# Patient Record
Sex: Male | Born: 1945 | Race: White | Hispanic: No | Marital: Married | Smoking: Former smoker
Health system: Southern US, Community
[De-identification: ages and names within clinical notes are randomized; demographics above are authoritative.]

## PROBLEM LIST (undated history)

## (undated) DIAGNOSIS — M720 Palmar fascial fibromatosis [Dupuytren]: Secondary | ICD-10-CM

## (undated) DIAGNOSIS — M545 Low back pain, unspecified: Secondary | ICD-10-CM

## (undated) DIAGNOSIS — I7300 Raynaud's syndrome without gangrene: Secondary | ICD-10-CM

## (undated) DIAGNOSIS — M2560 Stiffness of unspecified joint, not elsewhere classified: Secondary | ICD-10-CM

## (undated) DIAGNOSIS — F329 Major depressive disorder, single episode, unspecified: Secondary | ICD-10-CM

## (undated) DIAGNOSIS — L409 Psoriasis, unspecified: Secondary | ICD-10-CM

## (undated) DIAGNOSIS — IMO0001 Reserved for inherently not codable concepts without codable children: Secondary | ICD-10-CM

## (undated) DIAGNOSIS — C61 Malignant neoplasm of prostate: Secondary | ICD-10-CM

## (undated) DIAGNOSIS — R112 Nausea with vomiting, unspecified: Secondary | ICD-10-CM

## (undated) DIAGNOSIS — G8929 Other chronic pain: Secondary | ICD-10-CM

## (undated) DIAGNOSIS — R011 Cardiac murmur, unspecified: Secondary | ICD-10-CM

## (undated) DIAGNOSIS — M1990 Unspecified osteoarthritis, unspecified site: Secondary | ICD-10-CM

## (undated) DIAGNOSIS — S99911A Unspecified injury of right ankle, initial encounter: Secondary | ICD-10-CM

## (undated) DIAGNOSIS — F32A Depression, unspecified: Secondary | ICD-10-CM

## (undated) DIAGNOSIS — Z9889 Other specified postprocedural states: Secondary | ICD-10-CM

## (undated) DIAGNOSIS — H9190 Unspecified hearing loss, unspecified ear: Secondary | ICD-10-CM

## (undated) DIAGNOSIS — R51 Headache: Secondary | ICD-10-CM

## (undated) DIAGNOSIS — C801 Malignant (primary) neoplasm, unspecified: Secondary | ICD-10-CM

## (undated) DIAGNOSIS — N400 Enlarged prostate without lower urinary tract symptoms: Secondary | ICD-10-CM

## (undated) HISTORY — DX: Unspecified osteoarthritis, unspecified site: M19.90

## (undated) HISTORY — PX: ROTATOR CUFF REPAIR: SHX139

## (undated) HISTORY — DX: Raynaud's syndrome without gangrene: I73.00

## (undated) HISTORY — DX: Psoriasis, unspecified: L40.9

## (undated) HISTORY — DX: Cardiac murmur, unspecified: R01.1

---

## 1999-03-11 ENCOUNTER — Emergency Department (HOSPITAL_COMMUNITY): Admission: EM | Admit: 1999-03-11 | Discharge: 1999-03-11 | Payer: Self-pay | Admitting: Emergency Medicine

## 1999-03-11 ENCOUNTER — Encounter: Payer: Self-pay | Admitting: Emergency Medicine

## 2002-01-03 ENCOUNTER — Emergency Department (HOSPITAL_COMMUNITY): Admission: EM | Admit: 2002-01-03 | Discharge: 2002-01-03 | Payer: Self-pay | Admitting: Emergency Medicine

## 2004-01-14 ENCOUNTER — Encounter: Admission: RE | Admit: 2004-01-14 | Discharge: 2004-01-14 | Payer: Self-pay | Admitting: Internal Medicine

## 2004-03-10 ENCOUNTER — Encounter (INDEPENDENT_AMBULATORY_CARE_PROVIDER_SITE_OTHER): Payer: Self-pay | Admitting: *Deleted

## 2004-03-10 ENCOUNTER — Ambulatory Visit (HOSPITAL_COMMUNITY): Admission: RE | Admit: 2004-03-10 | Discharge: 2004-03-10 | Payer: Self-pay | Admitting: Surgery

## 2004-03-10 HISTORY — PX: MASS EXCISION: SHX2000

## 2005-08-29 ENCOUNTER — Ambulatory Visit: Payer: Self-pay | Admitting: Family Medicine

## 2005-11-19 ENCOUNTER — Ambulatory Visit: Payer: Self-pay | Admitting: Family Medicine

## 2005-12-14 ENCOUNTER — Ambulatory Visit: Payer: Self-pay | Admitting: Internal Medicine

## 2005-12-28 ENCOUNTER — Ambulatory Visit: Payer: Self-pay | Admitting: Internal Medicine

## 2005-12-31 ENCOUNTER — Encounter: Admission: RE | Admit: 2005-12-31 | Discharge: 2005-12-31 | Payer: Self-pay | Admitting: Internal Medicine

## 2006-02-28 ENCOUNTER — Ambulatory Visit: Payer: Self-pay | Admitting: Internal Medicine

## 2006-05-07 ENCOUNTER — Ambulatory Visit: Payer: Self-pay | Admitting: Internal Medicine

## 2006-05-27 ENCOUNTER — Ambulatory Visit: Payer: Self-pay | Admitting: Internal Medicine

## 2006-09-09 ENCOUNTER — Ambulatory Visit: Payer: Self-pay | Admitting: Internal Medicine

## 2006-11-07 ENCOUNTER — Ambulatory Visit: Payer: Self-pay | Admitting: Internal Medicine

## 2006-12-31 ENCOUNTER — Ambulatory Visit: Payer: Self-pay | Admitting: Internal Medicine

## 2006-12-31 LAB — CONVERTED CEMR LAB
Chol/HDL Ratio, serum: 4.2
Cholesterol: 193 mg/dL (ref 0–200)
LDL Cholesterol: 123 mg/dL — ABNORMAL HIGH (ref 0–99)
Total Bilirubin: 0.7 mg/dL (ref 0.3–1.2)
Total Protein: 6.5 g/dL (ref 6.0–8.3)
Triglyceride fasting, serum: 122 mg/dL (ref 0–149)

## 2007-01-07 ENCOUNTER — Ambulatory Visit: Payer: Self-pay | Admitting: Internal Medicine

## 2007-01-07 LAB — CONVERTED CEMR LAB
Basophils Relative: 0.8 % (ref 0.0–1.0)
Eosinophils Relative: 1.6 % (ref 0.0–5.0)
HCT: 40.9 % (ref 39.0–52.0)
Hemoglobin: 13.9 g/dL (ref 13.0–17.0)
Neutrophils Relative %: 46.8 % (ref 43.0–77.0)
RBC: 4.27 M/uL (ref 4.22–5.81)
RDW: 11.9 % (ref 11.5–14.6)
WBC: 6.9 10*3/uL (ref 4.5–10.5)

## 2007-02-07 ENCOUNTER — Ambulatory Visit: Payer: Self-pay | Admitting: Internal Medicine

## 2007-06-13 ENCOUNTER — Ambulatory Visit (HOSPITAL_COMMUNITY): Admission: RE | Admit: 2007-06-13 | Discharge: 2007-06-14 | Payer: Self-pay | Admitting: Specialist

## 2007-06-26 ENCOUNTER — Telehealth: Payer: Self-pay | Admitting: *Deleted

## 2007-08-05 ENCOUNTER — Telehealth: Payer: Self-pay | Admitting: Internal Medicine

## 2007-09-03 DIAGNOSIS — E785 Hyperlipidemia, unspecified: Secondary | ICD-10-CM | POA: Insufficient documentation

## 2007-09-03 DIAGNOSIS — L408 Other psoriasis: Secondary | ICD-10-CM | POA: Insufficient documentation

## 2007-09-15 ENCOUNTER — Telehealth (INDEPENDENT_AMBULATORY_CARE_PROVIDER_SITE_OTHER): Payer: Self-pay | Admitting: *Deleted

## 2007-09-17 ENCOUNTER — Ambulatory Visit: Payer: Self-pay | Admitting: Internal Medicine

## 2007-09-17 DIAGNOSIS — M1990 Unspecified osteoarthritis, unspecified site: Secondary | ICD-10-CM | POA: Insufficient documentation

## 2007-09-17 DIAGNOSIS — M67919 Unspecified disorder of synovium and tendon, unspecified shoulder: Secondary | ICD-10-CM | POA: Insufficient documentation

## 2007-09-17 DIAGNOSIS — M6530 Trigger finger, unspecified finger: Secondary | ICD-10-CM | POA: Insufficient documentation

## 2007-09-17 DIAGNOSIS — M719 Bursopathy, unspecified: Secondary | ICD-10-CM

## 2007-09-17 DIAGNOSIS — M545 Low back pain: Secondary | ICD-10-CM

## 2007-09-18 ENCOUNTER — Telehealth: Payer: Self-pay | Admitting: Internal Medicine

## 2007-09-24 ENCOUNTER — Telehealth (INDEPENDENT_AMBULATORY_CARE_PROVIDER_SITE_OTHER): Payer: Self-pay | Admitting: *Deleted

## 2007-10-20 ENCOUNTER — Telehealth: Payer: Self-pay | Admitting: Internal Medicine

## 2007-12-12 ENCOUNTER — Telehealth: Payer: Self-pay | Admitting: Internal Medicine

## 2007-12-23 ENCOUNTER — Ambulatory Visit: Payer: Self-pay | Admitting: Internal Medicine

## 2007-12-23 ENCOUNTER — Telehealth: Payer: Self-pay | Admitting: Internal Medicine

## 2007-12-23 DIAGNOSIS — N138 Other obstructive and reflux uropathy: Secondary | ICD-10-CM

## 2007-12-23 DIAGNOSIS — T887XXA Unspecified adverse effect of drug or medicament, initial encounter: Secondary | ICD-10-CM | POA: Insufficient documentation

## 2007-12-23 DIAGNOSIS — N401 Enlarged prostate with lower urinary tract symptoms: Secondary | ICD-10-CM

## 2007-12-23 LAB — CONVERTED CEMR LAB
AST: 20 units/L (ref 0–37)
Bilirubin, Direct: 0.2 mg/dL (ref 0.0–0.3)
Cholesterol: 230 mg/dL (ref 0–200)
Direct LDL: 144.4 mg/dL
HDL: 42.6 mg/dL (ref >39.0)
PSA: 0.49 ng/mL (ref 0.10–4.00)
Total CHOL/HDL Ratio: 5.4
Triglycerides: 122 mg/dL (ref 0–149)
VLDL: 24 mg/dL (ref 0–40)

## 2008-01-19 ENCOUNTER — Telehealth: Payer: Self-pay | Admitting: Internal Medicine

## 2008-01-23 ENCOUNTER — Telehealth: Payer: Self-pay | Admitting: Internal Medicine

## 2008-01-23 ENCOUNTER — Encounter: Payer: Self-pay | Admitting: Internal Medicine

## 2008-02-16 ENCOUNTER — Ambulatory Visit (HOSPITAL_COMMUNITY): Admission: RE | Admit: 2008-02-16 | Discharge: 2008-02-17 | Payer: Self-pay | Admitting: Specialist

## 2008-03-22 ENCOUNTER — Telehealth: Payer: Self-pay | Admitting: Internal Medicine

## 2008-05-12 ENCOUNTER — Ambulatory Visit: Payer: Self-pay | Admitting: Internal Medicine

## 2008-06-10 ENCOUNTER — Ambulatory Visit: Payer: Self-pay | Admitting: Internal Medicine

## 2008-06-23 ENCOUNTER — Telehealth: Payer: Self-pay | Admitting: Internal Medicine

## 2008-06-24 ENCOUNTER — Telehealth: Payer: Self-pay | Admitting: Internal Medicine

## 2008-06-24 ENCOUNTER — Encounter: Payer: Self-pay | Admitting: Internal Medicine

## 2008-06-24 ENCOUNTER — Telehealth: Payer: Self-pay | Admitting: *Deleted

## 2008-07-12 ENCOUNTER — Ambulatory Visit: Payer: Self-pay | Admitting: Internal Medicine

## 2008-07-12 DIAGNOSIS — M5030 Other cervical disc degeneration, unspecified cervical region: Secondary | ICD-10-CM | POA: Insufficient documentation

## 2008-07-12 DIAGNOSIS — G43909 Migraine, unspecified, not intractable, without status migrainosus: Secondary | ICD-10-CM | POA: Insufficient documentation

## 2008-08-10 ENCOUNTER — Ambulatory Visit: Payer: Self-pay | Admitting: Internal Medicine

## 2008-09-16 ENCOUNTER — Encounter: Admission: RE | Admit: 2008-09-16 | Discharge: 2008-09-16 | Payer: Self-pay | Admitting: Specialist

## 2008-10-04 ENCOUNTER — Ambulatory Visit: Payer: Self-pay | Admitting: Internal Medicine

## 2008-10-15 ENCOUNTER — Encounter: Payer: Self-pay | Admitting: Internal Medicine

## 2008-11-01 ENCOUNTER — Ambulatory Visit: Payer: Self-pay | Admitting: Internal Medicine

## 2008-12-15 ENCOUNTER — Ambulatory Visit: Payer: Self-pay | Admitting: Internal Medicine

## 2008-12-30 ENCOUNTER — Inpatient Hospital Stay (HOSPITAL_COMMUNITY): Admission: RE | Admit: 2008-12-30 | Discharge: 2008-12-31 | Payer: Self-pay | Admitting: Orthopedic Surgery

## 2008-12-30 HISTORY — PX: ANTERIOR CERVICAL DECOMP/DISCECTOMY FUSION: SHX1161

## 2009-02-04 ENCOUNTER — Telehealth: Payer: Self-pay | Admitting: Internal Medicine

## 2009-02-15 ENCOUNTER — Ambulatory Visit: Payer: Self-pay | Admitting: Internal Medicine

## 2009-03-28 ENCOUNTER — Ambulatory Visit: Payer: Self-pay | Admitting: Internal Medicine

## 2009-03-28 ENCOUNTER — Telehealth: Payer: Self-pay | Admitting: Internal Medicine

## 2009-03-28 DIAGNOSIS — R0789 Other chest pain: Secondary | ICD-10-CM

## 2009-04-25 ENCOUNTER — Ambulatory Visit: Payer: Self-pay | Admitting: Internal Medicine

## 2009-04-25 DIAGNOSIS — G5790 Unspecified mononeuropathy of unspecified lower limb: Secondary | ICD-10-CM | POA: Insufficient documentation

## 2009-05-19 ENCOUNTER — Telehealth: Payer: Self-pay | Admitting: *Deleted

## 2009-06-17 ENCOUNTER — Ambulatory Visit: Payer: Self-pay | Admitting: Internal Medicine

## 2009-06-17 DIAGNOSIS — N410 Acute prostatitis: Secondary | ICD-10-CM

## 2009-06-30 ENCOUNTER — Ambulatory Visit: Payer: Self-pay | Admitting: Internal Medicine

## 2009-07-25 ENCOUNTER — Telehealth (INDEPENDENT_AMBULATORY_CARE_PROVIDER_SITE_OTHER): Payer: Self-pay | Admitting: *Deleted

## 2009-08-09 ENCOUNTER — Ambulatory Visit: Payer: Self-pay | Admitting: Internal Medicine

## 2009-10-11 ENCOUNTER — Ambulatory Visit: Payer: Self-pay | Admitting: Internal Medicine

## 2009-10-11 DIAGNOSIS — K429 Umbilical hernia without obstruction or gangrene: Secondary | ICD-10-CM | POA: Insufficient documentation

## 2009-11-30 ENCOUNTER — Telehealth: Payer: Self-pay | Admitting: Internal Medicine

## 2009-12-12 ENCOUNTER — Telehealth: Payer: Self-pay | Admitting: Internal Medicine

## 2010-03-29 ENCOUNTER — Encounter: Payer: Self-pay | Admitting: Internal Medicine

## 2010-04-10 ENCOUNTER — Telehealth: Payer: Self-pay | Admitting: Internal Medicine

## 2010-05-25 ENCOUNTER — Telehealth: Payer: Self-pay | Admitting: Internal Medicine

## 2010-06-01 ENCOUNTER — Observation Stay (HOSPITAL_COMMUNITY): Admission: RE | Admit: 2010-06-01 | Discharge: 2010-06-02 | Payer: Self-pay | Admitting: Specialist

## 2010-06-01 HISTORY — PX: SHOULDER ARTHROSCOPY WITH ROTATOR CUFF REPAIR: SHX5685

## 2010-06-08 ENCOUNTER — Telehealth: Payer: Self-pay | Admitting: Internal Medicine

## 2010-07-11 ENCOUNTER — Telehealth: Payer: Self-pay | Admitting: Internal Medicine

## 2010-08-03 ENCOUNTER — Telehealth: Payer: Self-pay | Admitting: Internal Medicine

## 2010-09-26 ENCOUNTER — Telehealth: Payer: Self-pay | Admitting: Internal Medicine

## 2010-09-28 ENCOUNTER — Ambulatory Visit: Payer: Self-pay | Admitting: Internal Medicine

## 2010-12-12 ENCOUNTER — Telehealth: Payer: Self-pay | Admitting: Internal Medicine

## 2010-12-26 ENCOUNTER — Telehealth: Payer: Self-pay | Admitting: Internal Medicine

## 2010-12-28 ENCOUNTER — Ambulatory Visit: Admit: 2010-12-28 | Payer: Self-pay | Admitting: Internal Medicine

## 2011-01-23 NOTE — Progress Notes (Signed)
Summary: prednisone request  Phone Note Call from Patient Call back at Home Phone [phone]   Caller: vm - woman Summary of Call: Stung by several yellow jackets.  Eye swollen shut.  Round of Prednisone requested to [facility]. Happened last pm.  Took Benadryl.  Breathing is okay.  Rudy Jew, RN  July 11, 2010 8:14 AM  Initial call taken by: Rudy Jew, RN,  July 11, 2010 8:10 AM  Follow-up for Phone Call        per dr Lovell Sheehan may have prednisone 10 mg dose pack Follow-up by: Willy Eddy, LPN,  July 11, 2010 8:42 AM  Additional Follow-up for Phone Call Additional follow up Details #1::        Phone Call Completed Additional Follow-up by: Rudy Jew, RN,  July 11, 2010 9:03 AM    New/Updated Medications: PREDNISONE (PAK) 10 MG TABS (PREDNISONE) As Directed Prescriptions: PREDNISONE (PAK) 10 MG TABS (PREDNISONE) As Directed  #1 x 0   Entered by:   Rudy Jew, RN   Authorized by:   Stacie Glaze MD   Signed by:   Rudy Jew, RN on 07/11/2010   Method used:   Electronically to        [facility]* (retail)       [address] [facility] [address] [place] [employer] [place], [place]  [address]       Ph: [phone] or [phone]       Fax: [phone]   RxID:   [id number]

## 2011-01-23 NOTE — Letter (Signed)
Summary: Request for Surgical Clearance/[facility]  Request for Surgical Clearance/[facility]   Imported By: Maryln Gottron 04/03/2010 10:46:01  _____________________________________________________________________  External Attachment:    Type:   Image     Comment:   External Document

## 2011-01-23 NOTE — Progress Notes (Signed)
Summary: Pt req increased quantity for Oxycodone from #60 to #90  Phone Note Call from Patient Call back at [facility] Phone [phone] Call back at [phone] cell. Pt says to call either #   Caller: Patient Summary of Call: Pt called and is wanting to know if Dr. Lovell Sheehan can increase quantity of Oxycodone from #60 to 90#? Pt said that he has built up an immunity to med and is having to take more than prescribed amount so he is running out earlier. Pt req to pick up script by Friday 09/29/10 Initial call taken by: Lucy Antigua,  September 26, 2010 1:17 PM  Follow-up for Phone Call        pt has not had ov in over a year- needs ov to discuss Follow-up by: Willy Eddy, LPN,  September 26, 2010 1:50 PM  Additional Follow-up for Phone Call Additional follow up Details #1::        I called pt and sch ov for 09/28/10 at 11:15am as noted above.  Additional Follow-up by: Lucy Antigua,  September 26, 2010 2:53 PM

## 2011-01-23 NOTE — Progress Notes (Signed)
Summary: Pt req script for Oxycodone  Phone Note Call from Patient Call back at Home Phone [phone]   Caller: Patient Summary of Call: Pt called and is req script for Oxycodone. Pls call when ready for pick up.  Initial call taken by: Lucy Antigua,  May 25, 2010 8:15 AM  Follow-up for Phone Call        nnot due until 6/18-pt informed Follow-up by: Willy Eddy, LPN,  May 25, 3809 10:24 AM

## 2011-01-23 NOTE — Progress Notes (Signed)
Summary: REFILL REQUEST  Phone Note Refill Request Message from:  Patient on August 03, 2010 12:23 PM  Refills Requested: Medication #1:  OXYCODONE HCL 15 MG  TABS one by mouth two times a day as needed   Notes: Pt adv that he can be reached at [phone] when Rx is ready for p/u.    Initial call taken by: Debbra Riding,  August 03, 2010 12:23 PM  Follow-up for Phone Call        pt informed- ready for pick up Follow-up by: Willy Eddy, LPN,  August 04, 2010 10:58 AM    Prescriptions: OXYCODONE HCL 15 MG  TABS (OXYCODONE HCL) one by mouth two times a day as needed  #60 x 0   Entered by:   Willy Eddy, LPN   Authorized by:   Stacie Glaze MD   Signed by:   Willy Eddy, LPN on 91/47/8295   Method used:   Print then Give to Patient   RxID:   [id number] OXYCODONE HCL 15 MG  TABS (OXYCODONE HCL) one by mouth two times a day as needed  #60 x 0   Entered by:   Willy Eddy, LPN   Authorized by:   Stacie Glaze MD   Signed by:   Willy Eddy, LPN on 52/84/1324   Method used:   Print then Give to Patient   RxID:   [id number]

## 2011-01-23 NOTE — Progress Notes (Signed)
Summary: refill  Phone Note Refill Request Call back at Home Phone [phone] Message from:  Patient---live call  Refills Requested: Medication #1:  OXYCODONE HCL 15 MG  TABS one by mouth two times a day as needed    Prescriptions: OXYCODONE HCL 15 MG  TABS (OXYCODONE HCL) one by mouth two times a day as needed  #60 x 0   Entered by:   Willy Eddy, LPN   Authorized by:   Stacie Glaze MD   Signed by:   Willy Eddy, LPN on 09/81/1914   Method used:   Print then Give to Patient   RxID:   [id number] OXYCODONE HCL 15 MG  TABS (OXYCODONE HCL) one by mouth two times a day as needed  #60 x 0   Entered by:   Willy Eddy, LPN   Authorized by:   Stacie Glaze MD   Signed by:   Willy Eddy, LPN on 69/62/9528   Method used:   Print then Give to Patient   RxID:   [id number]

## 2011-01-23 NOTE — Progress Notes (Signed)
Summary: refill  Phone Note Refill Request Call back at Home Phone [phone] Message from:  Patient---live call  Refills Requested: Medication #1:  OXYCODONE HCL 15 MG  TABS one by mouth two times a day as needed pt would like to come by today and pick up.  Initial call taken by: Warnell Forester,  April 10, 2010 11:20 AM    Prescriptions: OXYCODONE HCL 15 MG  TABS (OXYCODONE HCL) one by mouth two times a day as needed  #60 x 0   Entered by:   Willy Eddy, LPN   Authorized by:   Stacie Glaze MD   Signed by:   Willy Eddy, LPN on 41/66/0630   Method used:   Print then Mail to Patient   RxID:   [id number] OXYCODONE HCL 15 MG  TABS (OXYCODONE HCL) one by mouth two times a day as needed  #60 x 0   Entered by:   Willy Eddy, LPN   Authorized by:   Stacie Glaze MD   Signed by:   Willy Eddy, LPN on 25/42/7062   Method used:   Print then Mail to Patient   RxID:   [id number]

## 2011-01-23 NOTE — Assessment & Plan Note (Signed)
Summary: MED CHECK/REFILLS/CJR   Vital Signs:  Patient profile:   65 year old male Height:      69 inches Weight:      202 pounds BMI:     29.94 Temp:     98.2 degrees F oral Pulse rate:   68 / minute Resp:     14 per minute BP sitting:   110 / 70  (left arm)  Vitals Entered By: Willy Eddy, LPN (September 28, 2010 12:12 PM) CC: roa-discuss pain meds Is Patient Diabetic? No   Primary Care Provider:  Stacie Glaze MD  CC:  roa-discuss pain meds.  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) 4)  Chest Pain, Atypical  (ICD-786.59) 5)  Disc Disease, Cervical  (ICD-722.4) 6)  Migraine, Chronic  (ICD-346.90) 7)  Degenerative Disc Disease, Cervical Spine  (ICD-722.4) 8)  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) 9)  Uns Advrs Eff Uns Rx Medicinal&biological Sbstnc  (ICD-995.20) 10)  Rotator Cuff Syndrome, Right  (ICD-726.10) 11)  Trigger Finger, Left Middle  (ICD-727.03) 12)  Family History Diabetes 1st Degree Relative  (ICD-V18.0) 13)  Family History of Cad Male 1st Degree Relative <50  (ICD-V17.3) 14)  Osteoarthritis  (ICD-715.90) 15)  Low Back Pain  (ICD-724.2) 16)  Psoriasis  (ICD-696.1) 17)  Hyperlipidemia  (ICD-272.4)  Current Problems (verified): 1)  Umbilical Hernia  (ICD-553.1) 2)  Prostatitis, Acute  (ICD-601.0) 3)  Peripheral Neuropathy, 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) 4)  Chest Pain, Atypical  (ICD-786.59) 5)  Disc Disease,  Cervical  (ICD-722.4) 6)  Migraine, Chronic  (ICD-346.90) 7)  Degenerative Disc Disease, Cervical Spine  (ICD-722.4) 8)  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 1 Ml Weekly 2)  Oxycodone Hcl 15 Mg  Tabs (Oxycodone Hcl) .... One By Mouth Two Times A Day As Needed 3)  Aspirin 81 Mg  Tbec (Aspirin) .... Daily 4)  Cvs Ibuprofen 200 Mg Tabs (Ibuprofen) .... 2 Three Times A Day 5)  Ambien Cr 12.5 Mg  Tbcr (Zolpidem Tartrate) .... At Bedtime As Needed 6)  Gabapentin 300 Mg Caps (Gabapentin) .... One By Mouth Three Times A Day ( May Give Three 100 By Mouth Three Times A Day) 7)  Nasonex 50 Mcg/act Susp (Mometasone Furoate) .Marland Kitchen.. 1 Spray Each Nostril Two Times A Day As Needed 8)  Fluoxetine Hcl 20 Mg Caps (Fluoxetine Hcl) .Marland Kitchen.. 1 Once  Daily 9)  Diclofenac Sodium 75 Mg Tbec (Diclofenac Sodium) .Marland Kitchen.. 1 Two Times A Day 10)  Maxalt-Mlt 10 Mg Tbdp (Rizatriptan Benzoate) .... One By Mouth As Needed Migraine 11)  Prednisone (Pak) 10 Mg Tabs (Prednisone) .... As Directed  Current Medications (verified): 1)  Enbrel 50 Mg/ml Soln (Etanercept) .Marland Kitchen.. 1 Ml Weekly 2)  Oxycodone Hcl 15 Mg  Tabs (Oxycodone Hcl) .... One By Mouth Two Times A Day As Needed 3)  Aspirin 81 Mg  Tbec (Aspirin) .... Daily 4)  Cvs Ibuprofen 200 Mg Tabs (Ibuprofen) .... 2 Three Times A Day 5)  Ambien Cr 12.5 Mg  Tbcr (Zolpidem Tartrate) .... At Bedtime As Needed 6)  Gabapentin 300 Mg Caps (Gabapentin) .... One By Mouth Three Times A  Day ( May Give Three 100 By Mouth Three Times A Day) 7)  Nasonex 50 Mcg/act Susp (Mometasone Furoate) .Marland Kitchen.. 1 Spray Each Nostril Two Times A Day As Needed 8)  Fluoxetine Hcl 20 Mg Caps (Fluoxetine Hcl) .Marland Kitchen.. 1 Once Daily 9)  Maxalt-Mlt 10 Mg Tbdp (Rizatriptan Benzoate) .... One By Mouth As Needed Migraine 10)  Sulfasalazine 500 Mg Tabs (Sulfasalazine) .Marland Kitchen.. 1 Two Times A Day  Allergies (verified): No Known Drug Allergies  Past History:  Family History: Last updated: 09/17/2007 Family History of CAD Male 1st degree relative <50 brothers and father Family History Diabetes 1st degree relative  mother Family History of Prostate CA 1st degree relative <50  Social History: Last updated: 09/17/2007 Married Former Smoker Alcohol use-no Drug use-no  Risk Factors: Smoking Status: quit (09/28/2010)  Past medical, surgical, family and social histories (including risk factors) reviewed, and no changes noted (except as noted below).  Past Medical History: Reviewed history from 08/10/2008 and no changes required. Hyperlipidemia psoriasis Low back pain Osteoarthritis raynauds  Past Surgical History: Rotator cuff repair x 2 cervical fusion 2 levels with nonunion  Family History: Reviewed history from 09/17/2007 and no changes required. Family History of CAD Male 1st degree relative <50 brothers and father Family History Diabetes 1st degree relative  mother Family History of Prostate CA 1st degree relative <50  Social History: Reviewed history from 09/17/2007 and no changes required. Married Former Smoker Alcohol use-no Drug use-no  Review of Systems  The patient denies anorexia, fever, weight loss, weight gain, vision loss, decreased hearing, hoarseness, chest pain, syncope, dyspnea on exertion, peripheral edema, prolonged cough, headaches, hemoptysis, abdominal pain, melena, hematochezia, severe indigestion/heartburn, hematuria, incontinence, genital sores, muscle weakness,  suspicious skin lesions, transient blindness, difficulty walking, depression, unusual weight change, abnormal bleeding, enlarged lymph nodes, angioedema, and breast masses.    Physical Exam  General:  Well-developed,well-nourished,in no acute distress; alert,appropriate and cooperative throughout examination Head:  male-pattern balding.  normocephalic.   Eyes:  No corneal or conjunctival inflammation noted. EOMI. Perrla. Funduscopic exam benign, without hemorrhages, exudates or papilledema. Vision grossly normal. Ears:  R ear normal and L ear normal.   Nose:  External nasal examination shows no deformity or inflammation. Nasal mucosa are pink and moist without lesions or exudates. Mouth:  Oral mucosa and oropharynx without lesions or exudates.  Teeth in good repair. Neck:  decreased ROM.  nuchal rigidity.   Lungs:  Normal respiratory effort, chest expands symmetrically. Lungs are clear to auscultation, no crackles or wheezes. Heart:  Normal rate and regular rhythm. S1 and S2 normal without gallop, murmur, click, rub or other extra sounds. Abdomen:  Bowel sounds positive,abdomen soft and non-tender without masses, organomegaly umbilical hernia.   Msk:  decreased  ROM, joint tenderness, and joint swelling.   tendon calcinosis of the right hand   plantar spur Extremities:  No clubbing, cyanosis, edema, or deformity noted with normal full range of motion of all joints.   Neurologic:  RUE sensory loss and LUE sensory loss.     Impression & Recommendations:  Problem # 1:  OSTEOARTHRITIS (ICD-715.90)  The following medications were removed from the medication list:    Diclofenac Sodium 75 Mg Tbec (Diclofenac sodium) .Marland Kitchen... 1 two times a day His updated medication list for this problem includes:    Oxycodone Hcl 15 Mg Tabs (Oxycodone hcl) ..... One by mouth three time s a day as needed    Aspirin 81 Mg Tbec (Aspirin) .Marland Kitchen... Daily    Cvs Ibuprofen 200 Mg Tabs (Ibuprofen) .Marland Kitchen... 2 three times a  day  Discussed use of medications, application of heat or cold, and exercises.   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 1 two times a day His updated medication list for this problem includes:    Oxycodone Hcl 15 Mg Tabs (Oxycodone hcl) ..... One by mouth three time s a day as needed    Aspirin 81 Mg Tbec (Aspirin) .Marland Kitchen... Daily    Cvs Ibuprofen 200 Mg Tabs (Ibuprofen) .Marland Kitchen... 2 three times a day  Discussed use of moist heat or ice, modified activities, medications, and stretching/strengthening exercises. Back care instructions given. To be seen in 2 weeks if no improvement; sooner if worsening of symptoms.   Complete Medication List: 1)  Enbrel 50 Mg/ml Soln (Etanercept) .Marland Kitchen.. 1 ml weekly 2)  Oxycodone Hcl 15 Mg Tabs (Oxycodone hcl) .... One by mouth three time s a day as needed 3)  Aspirin 81 Mg Tbec (Aspirin) .... Daily 4)  Cvs Ibuprofen 200 Mg Tabs (Ibuprofen) .... 2 three times a day 5)  Ambien Cr 12.5 Mg Tbcr (Zolpidem tartrate) .... At bedtime as needed 6)  Gabapentin 300 Mg Caps (Gabapentin) .... One by mouth three times a day ( may give three 100 by mouth three times a day) 7)  Nasonex 50 Mcg/act Susp (Mometasone furoate) .Marland Kitchen.. 1 spray each nostril two times a day as needed 8)  Fluoxetine Hcl 20 Mg Caps (Fluoxetine hcl) .Marland Kitchen.. 1 once daily 9)  Maxalt-mlt 10 Mg Tbdp (Rizatriptan benzoate) .... One by mouth as needed migraine 10)  Sulfasalazine 500 Mg Tabs (Sulfasalazine) .Marland Kitchen.. 1 two times a day Prescriptions: OXYCODONE HCL 15 MG  TABS (OXYCODONE HCL) one by mouth three time s a day as needed  #90 x 0   Entered and Authorized by:   Stacie Glaze MD   Signed by:   Stacie Glaze MD on 09/28/2010   Method used:   Print  then Give to Patient   RxID:   [id number] OXYCODONE HCL 15 MG  TABS (OXYCODONE HCL) one by mouth three time s a day as needed  #90 x 0   Entered and Authorized by:   Stacie Glaze MD   Signed by:   Stacie Glaze MD on 09/28/2010   Method used:   Print then Give to Patient   RxID:   [id number] OXYCODONE HCL 15 MG  TABS (OXYCODONE HCL) one by mouth three time s  a day as needed  #90 x 0   Entered and Authorized by:   Stacie Glaze MD   Signed by:   Stacie Glaze MD on 09/28/2010   Method used:   Print then Give to Patient   RxID:   [id number]    Immunization History:  Influenza Immunization History:    Influenza:  historical (09/23/2010)

## 2011-01-25 NOTE — Progress Notes (Signed)
Summary: refill  Phone Note Refill Request Call back at Home Phone [phone] Message from:  Patient--live call  Refills Requested: Medication #1:  OXYCODONE HCL 15 MG  TABS one by mouth three time s a day as needed call pt when ready. is scheduled for a possible surgery with Dr Shon Baton. pt cancelled his appt for this Thursday with Dr Lovell Sheehan.  Initial call taken by: Warnell Forester,  December 26, 2010 10:58 AM    Prescriptions: OXYCODONE HCL 15 MG  TABS (OXYCODONE HCL) one by mouth three time s a day as needed  #90 x 0   Entered by:   Willy Eddy, LPN   Authorized by:   Stacie Glaze MD   Signed by:   Willy Eddy, LPN on 14/78/2956   Method used:   Print then Give to Patient   RxID:   [id number] OXYCODONE HCL 15 MG  TABS (OXYCODONE HCL) one by mouth three time s a day as needed  #90 x 0   Entered by:   Willy Eddy, LPN   Authorized by:   Stacie Glaze MD   Signed by:   Willy Eddy, LPN on 28/41/3244   Method used:   Print then Give to Patient   RxID:   [id number] OXYCODONE HCL 15 MG  TABS (OXYCODONE HCL) one by mouth three time s a day as needed  #90 x 0   Entered by:   Willy Eddy, LPN   Authorized by:   Stacie Glaze MD   Signed by:   Willy Eddy, LPN on 42/59/5638   Method used:   Print then Give to Patient   RxID:   [id number]

## 2011-01-25 NOTE — Progress Notes (Signed)
Summary: Pt req script for Oxcodone HCL 15mg   Phone Note Call from Patient Call back at Home Phone [phone] Message from:  Patient on December 12, 2010 11:56 AM  Refills Requested: Medication #1:  OXYCODONE HCL 15 MG  TABS one by mouth three time s a day as needed   Dosage confirmed as above?Dosage Confirmed   Supply Requested: 3 months Pt will be out of town next wk and would like to pick script up by Friday of this wk.    Method Requested: Pick up at Office Initial call taken by: Lucy Antigua,  December 12, 2010 11:55 AM Reason for Call: Insurance Question Summary of Call: left message on machine too early- no tue until 1-6-call back around 1-4-5 for refill Initial call taken by: Willy Eddy, LPN,  December 12, 2010 1:54 PM

## 2011-03-06 ENCOUNTER — Telehealth: Payer: Self-pay | Admitting: Internal Medicine

## 2011-03-06 DIAGNOSIS — J329 Chronic sinusitis, unspecified: Secondary | ICD-10-CM

## 2011-03-06 NOTE — Telephone Encounter (Signed)
Called at 4:54

## 2011-03-06 NOTE — Telephone Encounter (Signed)
Pt called and is req antibiotic for sinus inf. Pls call in to [facility] [phone]

## 2011-03-07 MED ORDER — AZITHROMYCIN 250 MG PO TABS: 250.0000 mg | ORAL_TABLET | Freq: Every day | ORAL | Status: AC

## 2011-03-07 NOTE — Telephone Encounter (Signed)
PT CALLED

## 2011-03-12 LAB — CBC
Hemoglobin: 15.5 g/dL (ref 13.0–17.0)
RDW: 13.6 % (ref 11.5–15.5)
WBC: 5.5 10*3/uL (ref 4.0–10.5)

## 2011-03-12 LAB — BASIC METABOLIC PANEL
Calcium: 9 mg/dL (ref 8.4–10.5)
GFR calc Af Amer: >60 mL/min (ref >60)
GFR calc non Af Amer: >60 mL/min (ref >60)
Glucose, Bld: 74 mg/dL (ref 70–99)
Sodium: 139 mEq/L (ref 135–145)

## 2011-03-12 LAB — SURGICAL PCR SCREEN: MRSA, PCR: NEGATIVE

## 2011-03-20 ENCOUNTER — Telehealth: Payer: Self-pay | Admitting: Internal Medicine

## 2011-03-20 NOTE — Telephone Encounter (Signed)
Refill Oxycodone 

## 2011-03-22 ENCOUNTER — Other Ambulatory Visit: Payer: Self-pay | Admitting: *Deleted

## 2011-03-22 DIAGNOSIS — M549 Dorsalgia, unspecified: Secondary | ICD-10-CM

## 2011-03-22 MED ORDER — OXYCODONE HCL 15 MG PO TABS: 15.0000 mg | ORAL_TABLET | ORAL | Status: DC | PRN

## 2011-03-23 ENCOUNTER — Other Ambulatory Visit: Payer: Self-pay | Admitting: *Deleted

## 2011-03-23 DIAGNOSIS — M549 Dorsalgia, unspecified: Secondary | ICD-10-CM

## 2011-03-23 NOTE — Telephone Encounter (Signed)
DONE Left message on machine FOR PT

## 2011-04-06 ENCOUNTER — Telehealth: Payer: Self-pay | Admitting: Internal Medicine

## 2011-04-06 NOTE — Telephone Encounter (Signed)
Pt called and has another sinus inf. Req a zpak to be called in to [facility]. Pt says that Dr Lovell Sheehan prescribed zpak a few wks ago.

## 2011-04-06 NOTE — Telephone Encounter (Signed)
Pt informed that dr Lovell Sheehan is out of the office until Monday and will have to see another md for antibioitc--pt states he has no insurance and cannt afford to come in- will try over the counter meds and if not better will call back monday

## 2011-04-09 ENCOUNTER — Telehealth: Payer: Self-pay | Admitting: Internal Medicine

## 2011-04-09 ENCOUNTER — Other Ambulatory Visit: Payer: Self-pay | Admitting: *Deleted

## 2011-04-09 LAB — BASIC METABOLIC PANEL
CO2: 29 mEq/L (ref 19–32)
Calcium: 9.4 mg/dL (ref 8.4–10.5)
Chloride: 103 mEq/L (ref 96–112)
Chloride: 107 mEq/L (ref 96–112)
Creatinine, Ser: 0.87 mg/dL (ref 0.4–1.5)
GFR calc Af Amer: >60 mL/min (ref >60)
GFR calc Af Amer: >60 mL/min (ref >60)
Sodium: 140 mEq/L (ref 135–145)

## 2011-04-09 LAB — CBC
Hemoglobin: 12.7 g/dL — ABNORMAL LOW (ref 13.0–17.0)
MCHC: 33.2 g/dL (ref 30.0–36.0)
MCV: 97.3 fL (ref 78.0–100.0)
RBC: 3.94 MIL/uL — ABNORMAL LOW (ref 4.22–5.81)
RBC: 4.51 MIL/uL (ref 4.22–5.81)
WBC: 7.4 10*3/uL (ref 4.0–10.5)

## 2011-04-09 MED ORDER — AZITHROMYCIN 250 MG PO TABS: 250.0000 mg | ORAL_TABLET | Freq: Every day | ORAL | Status: DC

## 2011-04-09 NOTE — Telephone Encounter (Signed)
Patient spoke with Rushie Goltz last week and was told to call this morning to leave a message to get a Zpak for his sinus infection.  [facility].

## 2011-05-08 NOTE — Op Note (Signed)
[name], [name]                  ACCOUNT NO.:  [id number]   MEDICAL RECORD NO.:  [id number]          PATIENT TYPE:  OIB   LOCATION:  1527                         FACILITY:  [facility]   PHYSICIAN:  Jene Every, M.D.    DATE OF BIRTH:  December 22, 1946   DATE OF PROCEDURE:  02/16/2008  DATE OF DISCHARGE:  02/17/2008                               OPERATIVE REPORT   ADDENDUM:   ASSISTANT:  Roma Schanz, P.A.      Jene Every, M.D.  Electronically Signed     JB/MEDQ  D:  04/01/2008  T:  04/01/2008  Job:  [id number]

## 2011-05-08 NOTE — Op Note (Signed)
NAME:  NOLLIE, TERLIZZI                  ACCOUNT NO.:  [id number]   MEDICAL RECORD NO.:  [id number]          PATIENT TYPE:  AMB   LOCATION:  DAY                          FACILITY:  [facility]   PHYSICIAN:  Jene Every, M.D.    DATE OF BIRTH:  Jan 16, 1946   DATE OF PROCEDURE:  02/16/2008  DATE OF DISCHARGE:                               OPERATIVE REPORT   PREOPERATIVE DIAGNOSIS:  Curved rotator cuff tear of the right shoulder.   POSTOPERATIVE DIAGNOSIS:  Curved rotator cuff tear of the right  shoulder.   PROCEDURE:  Rotator cuff repair of the right shoulder.  1. Subacromial decompression.  2. Lysis of adhesions.  3. Application of a 4 x 2 patch graft.   ANESTHESIA:  General.   ASSISTANT:  __________ .   INDICATIONS FOR PROCEDURE:  This is a 65 year old gentleman with a  history of rotator cuff repair done _last year .  He underwent physical  therapy.  He returned from work and had a check and injury at work and  sustained a tear of the rotator cuff tendon, which was noted by MRI to  be a large tear and retracted, mainly in the center portion in the  supraspinatus.  It left a gap in the infraspinatus, surgically intact.  The biceps tendon was _intact. The tendon___was  indicated for repair.  _Risks including  bleeding, infection, inability to repair, need for  repair plus patch, a hemi-arthroscopy in the future, deep venous  thrombosis, pulmonary embolus and anesthetic complications, etc.   TECHNIQUE:  With the patient  __supine position after adequate  anesthesia, 2 grams of Kefzol.  The right shoulder and upper extremity  was prepped with Betadine in a sterile fashion.  A lateral incision was  made over the anterolateral aspect of the acromion.  The subcutaneous  tissue was checked by electrocautery to achieve hemostasis.  The raphe  between the anterolateral heads was identified.  Subperiosteal elevator  from the anterolateral aspect of the acromion.  _Preserved the  attachment of   the deltoid.  Removed the spur with a  rongeur from the  anterolateral aspect of the acromion.  Noted was a completely retracted  rotator cuff tear of the supraspinatus.  There was some tearing of the  drain of the infraspinatus. The subcapsularis and the biceps tendon both  appear to be intact.  We copiously lavaged the joint.  Interestingly  there was a good portion of the supraspinatus that was retained on the  greater tuberosity.  In fact, the tear was over the center of the  articular portion of the head.  There was no consent to the middle  supraspinatus, that was available for repair.  I mobilized the  subscapularis and the infraspinatus.  I was able to repair a portion of  the subscapularis, infraspinatus and around in the supraspinatus more  medically with #1 Ethibond interrupted mattress-type sutures.  That  closed that portion of the tendon side-to-side.  There was, however, a  diamond-shaped portion of the rotator cuff, mainly at the supraspinatus,  a void directly over the articular  surface of the biceps tendon there.  I tried multiple efforts to mobilize the tissue to cover the defect that  were unsuccessful.  I felt that, given that there was a fairly  significant over-hang of the residual supraspinatus stump fashioned  through the greater tuberosity and the leading edge of the subscapularis  and infraspinatus, a patch graft band could possibly fill the defect and  provide hopefully an outcome.  Therefore I used a patch 3 x 4 which had  soaked in saline for 10 minutes, folded it, contoured it, placed it over  the defect and I sutured it to the edges to the subscapular, the  infraspinatus and into the subscap infraspinatus into the stump of the  supraspinatus with #4-0 Monocryl interrupted sutures, approximately  eight were placed.  This covered the defect satisfactorily.  Again,  there was no room for a suture anchor and _the articulular surface was  just  underneath the  opening of the tear.  The repair was obtained after  using the graft.   The wound was copiously irrigated.  Internal and external rotation  revealed good coverage noted.  I repaired then the raphe with #1 Vicryl  figure-of-eight sutures and passed through the acromion.  The  subcutaneous tissue was reapproximated with #2-0 Vicryl sutures and the  skin was reapproximated with staples.  The patient was placed on an  abduction pillow, __________ and transported to the recovery room in  satisfactory condition.  The patient tolerated the procedure with no  complications.      Jene Every, M.D.  Electronically Signed     JB/MEDQ  D:  02/16/2008  T:  02/17/2008  Job:  [id number]

## 2011-05-08 NOTE — Discharge Summary (Signed)
NAMELOGON, UTTECH NO.:  [id number]   MEDICAL RECORD NO.:  [id number]          PATIENT TYPE:  INP   LOCATION:  5030                         FACILITY:  [facility]   PHYSICIAN:  Alvy Beal, MD    DATE OF BIRTH:  03-22-1946   DATE OF ADMISSION:  12/30/2008  DATE OF DISCHARGE:  12/31/2008                               DISCHARGE SUMMARY   ADMISSION DIAGNOSES:  1. Left radicular arm pain.  2. 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   DISCHARGE DIAGNOSES:  1. Left radicular arm pain.  2. 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   PROCEDURE:  Anterior cervical diskectomy and fusion at the C5-6 and C6-7  level.   BRIEF HISTORY:  Mr. Jesus Wallace is a very pleasant 65 year old gentleman who  initially presented to Dr. Shon Baton in his office with a longstanding  complaints of neck and left arm pain.  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  Clinically, he had C6 and  C7 nerve distribution radicular arm pain.  Attempts at conservative  management consisting of physical therapy, injection therapy, narcotic  medications had all failed to alleviate his symptoms.  As a result, the  patient elected to proceed with surgery.  All appropriate risks,  benefits, and alternatives to surgery were discussed and consent was  obtained.   HOSPITAL COURSE:  The patient's hospital course was approximately 1 day  in length.  The patient was transferred from the PACU to the orthopedic  floor without incident.  The patient tolerated the procedure very well.  Postoperatively day #1, the patient was doing very well, he was  tolerating soft foods.  He had had no shortness of breath or chest pain.  Abdomen was soft and nontender.  His arm pain had improved.  He was able  to ambulate with assistance.  He was having regular bowel and bladder  movements, and the patient was having no other difficulties.  The  patient was cleared by physical therapy; and therefore, he was deemed  stable to be discharged home.   DISCHARGE CONDITION:  Stable.   DISPOSITION:  The patient is discharged to home.   DISCHARGE MEDICATIONS:  The patient was discharged on all of his home  medications which included:  1. Morphine sulfate sustained release at 50 mg.  2. Lyrica 75 mg b.i.d.  3. Robaxin 500 mg.  4. Baby aspirin 81 mg daily.  5. Multivitamin daily.  6. Vitamin C daily.  7. Vitamin B12 daily.  8. Flunisolide spray 0.25 one spray to each nostril.  9. Fish oil 1000 mg.  10.The patient was discharged on Percocet 10/325 one tablet p.o. q.6      h. as needed for pain.  11.The patient was instructed to stop his oxycodone 5 mg.  12.He is instructed to stop his Ambien 10 mg.  13.He is also instructed to stop his ibuprofen 200 mg.   DISCHARGE INSTRUCTIONS:  The patient was given a  preprinted discharge  instructions that went through his wound care, showering activity level.  The patient was instructed that he may shower postoperatively day #5.  No lifting for 6 weeks, no driving for 6 weeks.  He should increase his  activity slowly.  He is to wear his cervical collar at all times when  ambulating.  He is to call the office if any redness, difficulty  swallowing, increased fevers or chills, increased arm pain and/or  increased neck pain.   FOLLOWUP:  The patient is instructed to follow up with Dr. Shon Baton in 2  weeks for suture removal and wound check.  He is to call and schedule  this appointment at our office at [phone].      Crissie Reese, PA      Alvy Beal, MD  Electronically Signed    AC/MEDQ  D:  02/14/2009  T:  02/15/2009  Job:  [id number]

## 2011-05-08 NOTE — Op Note (Signed)
NAMECOREN, SAGAN                  ACCOUNT NO.:  [id number]   MEDICAL RECORD NO.:  [id number]          PATIENT TYPE:  OIB   LOCATION:  1612                         FACILITY:  [facility]   PHYSICIAN:  Jene Every, M.D.    DATE OF BIRTH:  1946-06-18   DATE OF PROCEDURE:  06/13/2007  DATE OF DISCHARGE:                               OPERATIVE REPORT   PREOPERATIVE DIAGNOSES:  1. Rotator cuff tear.  2. Impingement syndrome.  3. AC arthrosis.   POSTOPERATIVE DIAGNOSES:  1. Rotator cuff tear.  2. Impingement syndrome.  3. AC arthrosis.   PROCEDURE PERFORMED:  1. Right rotator cuff repair.  2. Subacromial decompression.  3. Acromioplasty.  4. Failure of acromioplasty.   ANESTHESIA:  General.   ASSISTANT:  Roma Schanz, P.A.   JUSTIFICATION:  A 65 year old male shoulder pain, rotator cuff tear by  MRI, inflammation of the [facility] joint which was intact.  He had persistent  subacromial pain.  He was indicated for repair of the rotator cuff,  apparently had fallen again though after his MRI and before his surgery,  he stated he had increasing pain.  I discussed with him the possibility  that he has increased his tear.  The risks and benefits discussed  including bleeding, infection, suboptimal range of motion, need for  immobilization, recurrent tear, need for revision.   TECHNIQUE:  The patient supine in beach chair position.  After the  induction of adequate general anesthesia and 1 g of Kefzol the right  shoulder and upper extremity was prepped and draped in the usual sterile  fashion.  A marker was utilized to delineate the acromion, the [facility] joint.  A decision was made to enter the anterolateral aspect of the acromion  __________ .  The subcutaneous tissue was dissected, electrocautery was  utilized to achieve hemostasis.  Marcaine with epinephrine, 0.25%, was  infiltrated in the subcutaneous tissue.  The raphe between the anterior  and lateral heads of the deltoid was  identified, divided, subperiosteal  elevated from the anterolateral and the anteromedial aspect of the  acromion.  The AC ligament was divided and attached, oscillating saw  utilized to perform an acromioplasty with type 1 protecting the deltoid.  Exuberant synovial fluid was evacuated, appeared normal.  The wound and  the joint copiously irrigated.  It was a significant tear, more  extensive than that seen on the MRI.  It was interesting to know it was  kind of longitudinal and split, retracted posteriorly, medially and then  from the supraspinatus.  He had a fairly significant portion of the  rotator cuff of the insertion of the supraspinatus that was still  intact.  Decorticated some of the lateral bone and then mobilized the  rotator cuff near the glenohumeral joint.  The anterior and posterior  leaflets were approximated after digitally mobilizing with the Allis and  the Cobb.  The ends were debrided.  A pair of #1 Ethibond interrupted  figure-of-eight sutures anterior to posterior.  Once that leaflet was  reapproximated then we approximated then in a Mercedes type fashion to  the  remnant of the supraspinatus to the trough with #1 Vicryl and  interrupted figure-of-eight sutures.  With a full approximating of the  tear and full coverage, it was felt initially when looking at it that  perhaps it was a good repair of the left tear.  So no need to utilize  suture anchors as this significant portion of the supraspinatus was  still attached to the greater tuberosity, it was almost torn in its mid  substance.  This was oversewn with #0 and #1 Vicryl interrupted figure-  of-eight sutures for a watertight closure.  I arranged it external  rotation 35 degrees, internal rotation full, abduction to 110, extension  and flexion without tension on the repair site.  After seeing this, we  had to perform a bursectomy just prior to this and felt we had a  satisfactory repair of the rotator cuff.  Wound  copiously irrigated.  It  then repaired the arthrotomy with #1 Vicryl interrupted figure-of-eight  sutures together and over top of and through the acromion.  Subcutaneous  tissue reapproximated 2-0 Vicryl simple stitches, the skin was  reapproximated 4-0 subcuticular only.  Marcaine with epinephrine, 0.25%,  was infiltrated in the subacromial joint and AC joint in the surgical  incision.  The wound was dressed sterilely.  Placed in an abduction  pillow.  Extubated without difficulty and transported to the recovery  room in satisfactory condition.  The patient tolerated the procedure  well, there were no complications.      Jene Every, M.D.  Electronically Signed     JB/MEDQ  D:  06/13/2007  T:  06/13/2007  Job:  [id number]

## 2011-05-08 NOTE — Op Note (Signed)
NAME:  Jesus Wallace, Jesus Wallace                  ACCOUNT NO.:  [id number]   MEDICAL RECORD NO.:  [id number]          PATIENT TYPE:  OIB   LOCATION:  5030                         FACILITY:  [facility]   PHYSICIAN:  Alvy Beal, MD    DATE OF BIRTH:  1946-07-09   DATE OF PROCEDURE:  12/30/2008  DATE OF DISCHARGE:                               OPERATIVE REPORT   PREOPERATIVE DIAGNOSIS:  Left radicular arm pain, degenerative bone  spurs, soft disk protrusion at C5-6 and C6-7.   POSTOPERATIVE DIAGNOSIS:  Left radicular arm pain, bone spurs, soft disk  protrusion at C5-6 and C6-7.   OPERATIVE PROCEDURE:  Anterior cervical diskectomy and fusion, C5-6 and  C6-7.   INSTRUMENTATION USED:  Synthes MTF interbody fibular precut grafts, size  8 height, lordotic at both C5-6 and C6-7 path with Actifuse with an  anterior cervical Vector cervical plate from Synthes with 14-mm variable  angle screws at C5 and C7 and 6 screws at C6.   COMPLICATIONS:  None.   CONDITION:  Stable.   Minimal blood loss.   FIRST ASSISTANT:  Levonne Hubert, M.D.   HISTORY:  Donzell is a very pleasant 65 year old gentleman who presented  to my office with longstanding complaints of neck and left arm pain.  Clinical and radiographic analysis confirmed the diagnosis of foraminal  stenosis secondary to hard bone spur formation at C5-6 and C6-7.  Clinically, he had C6 and C7 radicular arm pain.  Attempts at  conservative management consisting of physical therapy, injection  therapy, and narcotic medications have failed to alleviate his symptoms.  As a result, the patient elected to proceed with surgery.  All  appropriate risks, benefits, and alternatives were discussed and consent  was obtained.   OPERATIVE NOTE:  The patient was brought to the operating room and  placed supine on the operating table.  After successful induction of  general anesthesia and endotracheal intubation, TEDs and SCDs were  applied.  The shoulders were  taped down as well as the towels were  placed between the shoulder blades and the anterior cervical spine was  prepped and draped in a standard fashion.  A horizontal incision was  made at the level of the cricoid cartilage, and sharp dissection was  carried out down to and through the platysma.  I then sharply dissected  in the deep cervical fascia.  I identified the omohyoid, released it  from its sleeve, and swept it medially.  I then continued the dissection  bluntly through the deep cervical fascia keeping the carotid sheath  protected laterally with my finger and sweeping the trachea and  esophagus medially (to the right) and protecting that with another  finger.  I then placed a thyroid retractor into the wound to retract the  trachea and esophagus.  At this point, I was down through the  prevertebral fascia and visualized the anterior cervical spine.   Using Kellogg, I mobilized the prevertebral fascia from the  midbody of C5 to the midbody of C7.   At this point in time, I placed the needle into  the C5-6 disk space and  took an intraoperative x-ray.  I confirmed that I was at the appropriate  level and then continued to mobilize the longus coli muscles.   Using a bipolar electrocautery, I mobilized the longus coli muscles so  that I could visualize the entire anterior width of the vertebral body.  This allowed me to expose from uncovertebral joint to uncovertebral  joint.  I then placed a self-retaining retractor into the wound,  deflated the endotracheal cuff, and then reinflated it.  I then had  excellent visualization of the C5-6 disk space with the carotid sheath  protected as well as trachea and esophagus protected.   I incised the annulus with a 15-blade scalpel and then began removing  the disk material with pituitary rongeurs.  Using a combination of  curettes, Kerrison rongeurs, and pituitary rongeurs, I removed the  entire disk material as well as the  overhanging osteophyte from the C5  vertebral body.  I then developed a plane using a fine microcurette  behind the posterior longitudinal ligament and resected the posterior  longitudinal ligament with a 1-mm Kerrison.  At this point, there was a  small fragment of disk material in the left-hand side that I removed.  There was also some bone spurs in the uncovertebral joint which I also  took in.  At this point, I rasped the endplates, so I had nice bleeding  subchondral bone.  I irrigated this level copiously with normal saline,  measured the interbody space, obtained an 8-mm lordotic fibular precut  graft, hydrated it, packed with Actifuse, and malleted to the  appropriate depth.  Once I had completed the C5-6 diskectomy and fusion,  I then repositioned the retractors, placed another distraction pin into  the body of C7, distracted the interbody space and using the same  technique I did at C5-6, I performed a diskectomy and fusion at C6-7.   Once both 8-mm grafts were properly positioned and well seated, I  removed the anterior spurs from the bodies of C7, C6, and C5 and then  contoured a 32-mm anterior cervical plate.  I then secured it to C5 and  C7 with 14-mm variable screws that locked to the plate.  I then placed  fixed screws into C6.  At this point, I removed the remaining  retractors, irrigated the wound copiously with normal saline, and then  checked to ensure that the esophagus did not become entrapped under the  plate.  Once I had confirmed that the esophagus was free, I then  returned it to the midline position, checked the carotid sheath to make  sure there was no undue bleeding and I then began closure.   The platysma was reapproximated with 2-0 Vicryl sutures and a 3-0  Monocryl was used to close the skin.  Steri-Strips, dry dressing, and an  Aspen collar were applied.  The patient was then extubated and  transferred to PACU without incident.  At the end of the case,  all  needle and sponge counts were correct.      Alvy Beal, MD  Electronically Signed     DDB/MEDQ  D:  12/30/2008  T:  12/31/2008  Job:  [id number]

## 2011-05-11 NOTE — Op Note (Signed)
NAME:  HAWLEY, MICHEL NO.:  [id number]   MEDICAL RECORD NO.:  [id number]                   PATIENT TYPE:  OIB   LOCATION:  2899                                 FACILITY:  [facility]   PHYSICIAN:  Abigail Miyamoto, M.D.              DATE OF BIRTH:  1946-04-03   DATE OF PROCEDURE:  03/10/2004  DATE OF DISCHARGE:                                 OPERATIVE REPORT   PREOPERATIVE DIAGNOSIS:  Left posterior chest mass.   POSTOPERATIVE DIAGNOSIS:  Left posterior chest mass.   OPERATION PERFORMED:  Excision of left posterior chest mass (8 cm).   SURGEON:  Douglas A. Magnus Ivan, M.D.   ANESTHESIA:  1% lidocaine and monitored anesthesia care.   ESTIMATED BLOOD LOSS:  Minimal.   DESCRIPTION OF PROCEDURE:  The patient was brought to the operating room and  identified as Jesus Wallace.  He was placed supine on the operating table and  anesthesia was induced.  The patient was then placed in right lateral  decubitus position.  The palpable mass was then identified.  The area was  prepped and draped in the usual sterile fashion.  The skin overlying the  mass was anesthetized with 1% lidocaine.  Incision was carried down through  the fascia with the electrocautery.  The underlying muscles were then split  bluntly getting down to the deeper chest muscles.  These were then split and  retracted bluntly to reveal the very large mass which was approximately 8 cm  in size sitting right on top of the right of the ribs.  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  The mass was then excised off the ribs with the  electrocautery.  Once the mass was completely excised, it was sent to  pathology for identification.  A small defect in the muscle overlying the  ribs was then closed with interrupted 2-0 Vicryl sutures.  The muscles were  then allowed to relax back in place.  The fascia over the top was then  closed with interrupted 2-0 Vicryl sutures.  The skin was closed with  running 3-0  Vicryl suture.  Steri-Strips, gauze and tape were then applied.  The patient tolerated the procedure well.  All counts were correct at the  end of the procedure.  The patient was then extubated in the operating room  and taken in stable condition to the recovery room.                                               Abigail Miyamoto, M.D.    DB/MEDQ  D:  03/10/2004  T:  03/13/2004  Job:  [id number]

## 2011-05-15 ENCOUNTER — Encounter: Payer: Self-pay | Admitting: Internal Medicine

## 2011-05-25 ENCOUNTER — Encounter: Payer: Self-pay | Admitting: Internal Medicine

## 2011-05-25 ENCOUNTER — Ambulatory Visit (INDEPENDENT_AMBULATORY_CARE_PROVIDER_SITE_OTHER): Payer: Medicare Other | Admitting: Internal Medicine

## 2011-05-25 VITALS — BP 140/84 | HR 68 | Temp 98.2°F | Resp 16 | Ht 69.0 in | Wt 199.0 lb

## 2011-05-25 DIAGNOSIS — M5030 Other cervical disc degeneration, unspecified cervical region: Secondary | ICD-10-CM

## 2011-05-25 DIAGNOSIS — M545 Low back pain, unspecified: Secondary | ICD-10-CM

## 2011-05-25 DIAGNOSIS — Q761 Klippel-Feil syndrome: Secondary | ICD-10-CM

## 2011-05-25 DIAGNOSIS — M6530 Trigger finger, unspecified finger: Secondary | ICD-10-CM

## 2011-05-25 MED ORDER — OXYCODONE HCL 15 MG PO TB12: 15.0000 mg | ORAL_TABLET | Freq: Three times a day (TID) | ORAL | Status: DC | PRN

## 2011-05-25 NOTE — Patient Instructions (Signed)
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 He has had consistent and continued relief from a stimilator unit therefore I recommend that he be allowed to continue to use the neurostimulator unit.

## 2011-05-25 NOTE — Progress Notes (Signed)
  Subjective:    Patient ID: Jesus Wallace, male    DOB: Apr 09, 1946, 65 y.o.   MRN: [id number]  HPI Patient presents for discussion of pain control and to worsening problems.  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  He also has shoulder pain which may or may not be related to the cervical disc disease he has an appointment with the neurosurgeon to discuss this if he requires a refill of his pain medications he will contact our office.  Patient also has trigger finger on his right third digit.  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  He is successfully received relief after injection releasing the tendon catching as well as controlling pain   Review of Systems  Constitutional: Negative for fever and fatigue.  HENT: Negative for hearing loss, congestion, neck pain and postnasal drip.   Eyes: Negative for discharge, redness and visual disturbance.  Respiratory: Negative for cough, shortness of breath and wheezing.   Cardiovascular: Negative for leg swelling.  Gastrointestinal: Negative for abdominal pain, constipation and abdominal distention.  Genitourinary: Negative for urgency and frequency.  Musculoskeletal: Positive for back pain, joint swelling and arthralgias.  Skin: Negative for color change and rash.  Neurological: Negative for weakness and light-headedness.  Hematological: Negative for adenopathy.  Psychiatric/Behavioral: Negative for behavioral problems.   Past Medical History  Diagnosis Date  . Hyperlipidemia   . Psoriasis   . Low back pain   . Arthritis   . Raynaud disease    Past Surgical History  Procedure Date  . Rotator cuff repair   . Cervical fusion     2 levels with nonunion    reports that he quit smoking about 32 years ago. He does not  have any smokeless tobacco history on file. He reports that he does not drink alcohol or use illicit drugs. family history includes Diabetes in his father; Heart disease in his father; and Prostate cancer in an unspecified family member. No Known Allergies     Objective:   Physical Exam  Constitutional: He appears well-developed and well-nourished.  HENT:  Head: Normocephalic and atraumatic.  Eyes: Conjunctivae are normal. Pupils are equal, round, and reactive to light.  Neck: Normal range of motion. Neck supple.  Cardiovascular: Normal rate and regular rhythm.   Pulmonary/Chest: Effort normal and breath sounds normal.  Abdominal: Soft. Bowel sounds are normal.  Musculoskeletal:       Tendon calcinosis and trigger          Assessment & Plan:  Discussion of proposed neurosurgical intervention this neck.  I spent more than 30 minutes counseling this patient about the risks and benefits of the surgical procedure.  Patient's right hand was prepped in a sterile manner and 20 mg of Depo-Medrol and quarter cc of lidocaine was injected into the flexor tendon of the right hand third digit patient tolerated the procedure well aftercare instructions were given

## 2011-05-29 MED ORDER — METHYLPREDNISOLONE ACETATE 40 MG/ML IJ SUSP: 20.0000 mg | Freq: Once | INTRAMUSCULAR | Status: DC

## 2011-06-25 ENCOUNTER — Telehealth: Payer: Self-pay | Admitting: Internal Medicine

## 2011-06-25 NOTE — Telephone Encounter (Signed)
Myself and Dr Cato Mulligan looked at when the rx was printed and it was on 05/25/11 so he should be able to refill med.  Pt said he wait till Dr Lovell Sheehan came on Friday

## 2011-06-25 NOTE — Telephone Encounter (Signed)
Pt called and said that his oxycodone was suppose to be changed to a #90. Last script was only sent in for #60. Pt was given 3 scripts. Pt req to pick up scripts with corrected #90 qty.

## 2011-06-28 ENCOUNTER — Other Ambulatory Visit: Payer: Self-pay | Admitting: *Deleted

## 2011-06-28 MED ORDER — OXYCODONE HCL 15 MG PO TB12: 15.0000 mg | ORAL_TABLET | Freq: Three times a day (TID) | ORAL | Status: DC | PRN

## 2011-06-28 NOTE — Telephone Encounter (Signed)
Pt is correct per last ov- script ready for signature

## 2011-07-24 ENCOUNTER — Telehealth: Payer: Self-pay | Admitting: Internal Medicine

## 2011-07-24 MED ORDER — OXYCODONE HCL 15 MG PO TABS: 15.0000 mg | ORAL_TABLET | Freq: Four times a day (QID) | ORAL | Status: DC | PRN

## 2011-07-24 NOTE — Telephone Encounter (Signed)
Pt called and said that he took script for Oxycodone to [facility], and was told by pharmacist that the script is incorrect. Script was written for Oxycotin XR 15 mg #90. Pt would like to turn these scripts back in to Dr Lovell Sheehan and pick up corrected scripts. Needs to be for req Oxycodone 15mg  #90

## 2011-07-25 ENCOUNTER — Other Ambulatory Visit: Payer: Self-pay | Admitting: *Deleted

## 2011-07-25 NOTE — Telephone Encounter (Signed)
Pt's wife informed that new meds are ready for pick up

## 2011-08-02 ENCOUNTER — Other Ambulatory Visit: Payer: Self-pay | Admitting: *Deleted

## 2011-08-24 ENCOUNTER — Ambulatory Visit (INDEPENDENT_AMBULATORY_CARE_PROVIDER_SITE_OTHER): Payer: Medicare Other | Admitting: Internal Medicine

## 2011-08-24 ENCOUNTER — Telehealth: Payer: Self-pay | Admitting: Internal Medicine

## 2011-08-24 ENCOUNTER — Other Ambulatory Visit: Payer: Self-pay | Admitting: *Deleted

## 2011-08-24 VITALS — BP 130/75 | HR 72 | Temp 98.2°F | Resp 16 | Ht 69.0 in | Wt 204.0 lb

## 2011-08-24 DIAGNOSIS — G43809 Other migraine, not intractable, without status migrainosus: Secondary | ICD-10-CM

## 2011-08-24 DIAGNOSIS — K429 Umbilical hernia without obstruction or gangrene: Secondary | ICD-10-CM

## 2011-08-24 DIAGNOSIS — G43109 Migraine with aura, not intractable, without status migrainosus: Secondary | ICD-10-CM

## 2011-08-24 DIAGNOSIS — IMO0002 Reserved for concepts with insufficient information to code with codable children: Secondary | ICD-10-CM

## 2011-08-24 DIAGNOSIS — M5412 Radiculopathy, cervical region: Secondary | ICD-10-CM

## 2011-08-24 LAB — BASIC METABOLIC PANEL
CO2: 35 mEq/L — ABNORMAL HIGH (ref 19–32)
Calcium: 9.2 mg/dL (ref 8.4–10.5)
Creatinine, Ser: 1.2 mg/dL (ref 0.4–1.5)
GFR: 64.34 mL/min (ref >60.00)
Sodium: 141 mEq/L (ref 135–145)

## 2011-08-24 NOTE — Telephone Encounter (Signed)
Pt says that his check out sheet from todays ov on 08/24/11 says that 2 injections were administered today and pt said that these shots were done in June 2012. Pt just wants to make sure that he is not additionally charged. Pt did not rcvd shots today.

## 2011-08-24 NOTE — Telephone Encounter (Signed)
No charges

## 2011-08-24 NOTE — Progress Notes (Signed)
Subjective:    Patient ID: Jesus Wallace, male    DOB: January 11, 1946, 65 y.o.   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  He also goes to the [place]. And is seen for [employer].  He has comorbid findings that we treat her hyperlipidemia chronic migraine for full neuropathy which is probably secondary to his spinal disease and a new complaint of a worsening umbilical hernia   Review of Systems  Constitutional: Negative for fever and fatigue.  HENT: Negative for hearing loss, congestion, neck pain and postnasal drip.   Eyes: Negative for discharge, redness and visual disturbance.  Respiratory: Negative for cough, shortness of breath and wheezing.   Cardiovascular: Negative for leg swelling.  Gastrointestinal: Negative for abdominal pain, constipation and abdominal distention.  Genitourinary: Negative for urgency and frequency.  Musculoskeletal: Negative for joint swelling and arthralgias.  Skin: Negative for color change and rash.  Neurological: Negative for weakness and light-headedness.  Hematological: Negative for adenopathy.  Psychiatric/Behavioral: Negative for behavioral problems.   Past Medical History  Diagnosis Date  . Hyperlipidemia   . Psoriasis   . Low back pain   . Arthritis   . Raynaud disease    Past Surgical History  Procedure Date  . Rotator cuff repair   . Cervical fusion     2 levels with nonunion    reports that he quit smoking about 32 years ago. He does not have any smokeless tobacco history on file. He reports that he does not drink alcohol or use illicit drugs. family history includes Diabetes in his father; Heart disease in his  father; and Prostate cancer in an unspecified family member. No Known Allergies     Objective:   Physical Exam  Nursing note reviewed. Constitutional: He appears well-developed and well-nourished.  HENT:  Head: Normocephalic and atraumatic.  Eyes: Conjunctivae are normal. Pupils are equal, round, and reactive to light.  Neck: Normal range of motion. Neck supple.  Cardiovascular: Normal rate and regular rhythm.   Pulmonary/Chest: Effort normal and breath sounds normal.  Abdominal: Soft. Bowel sounds are normal.       Umbilical hernia at 2 cm          Assessment & Plan:  A referral is planned to Gen. surgery to evaluate the umbilical hernia I do not believe it is surgical at this time but it has almost doubled in size over a six-month period of time.  He also has a slight ventral fullness that I do not believe is a full ventral hernia but a probable complicating effects of the umbilical hernia.  We discussed his orthopedic problems including his neurological problems associated with his spinal disease.  At this time I counseled the patient that the pain is not worsening in his functioning level is relatively high able to do the things that he desires despite being disabled for work then the risk of surgery may not be warranted but that he had to wait carefully his functioning status and pain level and pain control levels before risking a neurological procedure that may result in a greater deficit.  The patient says that this is similar to what he's been told by both  surgeons and he will evaluate carefully before proceeding to surgery

## 2011-08-26 ENCOUNTER — Encounter: Payer: Self-pay | Admitting: Internal Medicine

## 2011-09-05 ENCOUNTER — Encounter (INDEPENDENT_AMBULATORY_CARE_PROVIDER_SITE_OTHER): Payer: Self-pay | Admitting: Surgery

## 2011-09-14 ENCOUNTER — Telehealth: Payer: Self-pay | Admitting: Internal Medicine

## 2011-09-14 LAB — DIFFERENTIAL
Eosinophils Absolute: 0.1
Eosinophils Relative: 1
Lymphs Abs: 1.7
Monocytes Relative: 6

## 2011-09-14 LAB — CBC
MCHC: 34.3
RBC: 4.55
WBC: 5.2

## 2011-09-14 LAB — COMPREHENSIVE METABOLIC PANEL
ALT: 26
AST: 27
Alkaline Phosphatase: 31 — ABNORMAL LOW
CO2: 32
Calcium: 9.6
GFR calc Af Amer: >60
GFR calc non Af Amer: >60
Potassium: 4.6
Sodium: 141
Total Protein: 6.8

## 2011-09-14 NOTE — Telephone Encounter (Signed)
Pt need 3 written rx for oxycodone. Pt will be in town on Monday 09-17-11.

## 2011-09-17 ENCOUNTER — Encounter (INDEPENDENT_AMBULATORY_CARE_PROVIDER_SITE_OTHER): Payer: Self-pay | Admitting: Surgery

## 2011-09-17 ENCOUNTER — Ambulatory Visit (INDEPENDENT_AMBULATORY_CARE_PROVIDER_SITE_OTHER): Payer: Medicare Other | Admitting: Surgery

## 2011-09-17 ENCOUNTER — Other Ambulatory Visit: Payer: Self-pay | Admitting: *Deleted

## 2011-09-17 VITALS — BP 118/66 | HR 64 | Temp 97.0°F | Resp 16 | Ht 69.0 in | Wt 205.0 lb

## 2011-09-17 DIAGNOSIS — K429 Umbilical hernia without obstruction or gangrene: Secondary | ICD-10-CM

## 2011-09-17 MED ORDER — OXYCODONE HCL 15 MG PO TABS: 15.0000 mg | ORAL_TABLET | Freq: Four times a day (QID) | ORAL | Status: DC | PRN

## 2011-09-17 NOTE — Progress Notes (Signed)
Chief Complaint  Patient presents with  . Other    Eval umb hernia    HPI Jesus Wallace is a 65 y.o. male.   HPI Pleasant gentleman referred by Dr. Darryll Capers for evaluation of an umbilical hernia. The patient reports he has had the hernia were approximately 2 years. It is now getting larger and more uncomfortable. It always easily reduces. She has had no obstructive symptoms. The pain is described as sharp and moderate. Past Medical History  Diagnosis Date  . Hyperlipidemia   . Psoriasis   . Low back pain   . Arthritis   . Raynaud disease   . Heart murmur   . Generalized headaches     with occasional migraines  . Hearing loss   . Leg swelling   . Trouble swallowing     Past Surgical History  Procedure Date  . Rotator cuff repair   . Cervical fusion     2 levels with nonunion    Family History  Problem Relation Age of Onset  . Prostate cancer    . Heart disease Father   . Diabetes Father   . Cancer Father     prostate    Social History History  Substance Use Topics  . Smoking status: Former Smoker    Quit date: 09/16/1986  . Smokeless tobacco: Not on file  . Alcohol Use: No    No Known Allergies  Current Outpatient Prescriptions  Medication Sig Dispense Refill  . morphine (MSIR) 15 MG tablet Take 15 mg by mouth as needed.        Marland Kitchen aspirin 81 MG tablet Take 81 mg by mouth daily.        Marland Kitchen etanercept (ENBREL) 50 MG/ML injection Inject 50 mg into the skin once a week.        Marland Kitchen FLUoxetine (PROZAC) 20 MG tablet Take 20 mg by mouth daily.        Marland Kitchen gabapentin (NEURONTIN) 300 MG capsule Take 300 mg by mouth 3 (three) times daily.        Marland Kitchen ibuprofen (ADVIL,MOTRIN) 200 MG tablet Take 200 mg by mouth every 8 (eight) hours as needed.        . mometasone (NASONEX) 50 MCG/ACT nasal spray 2 sprays by Nasal route daily.        Marland Kitchen oxyCODONE (ROXICODONE) 15 MG immediate release tablet Take 1 tablet (15 mg total) by mouth every 6 (six) hours as needed.  90 tablet  0  .  predniSONE (DELTASONE) 5 MG tablet Take 5 mg by mouth daily.        . rizatriptan (MAXALT) 10 MG tablet Take 10 mg by mouth as needed. May repeat in 2 hours if needed       . sulfaSALAzine (AZULFIDINE) 500 MG tablet Take 500 mg by mouth 2 (two) times daily.        Marland Kitchen zolpidem (AMBIEN CR) 12.5 MG CR tablet Take 12.5 mg by mouth at bedtime as needed.         Current Facility-Administered Medications  Medication Dose Route Frequency Provider Last Rate Last Dose  . methylPREDNISolone acetate (DEPO-MEDROL) injection 20 mg  20 mg Intra-articular Once Carrie Mew        Review of Systems Review of Systems  Constitutional: Negative.   HENT: Positive for hearing loss and trouble swallowing.   Eyes: Negative.   Respiratory: Negative.   Cardiovascular: Negative.   Gastrointestinal: Positive for abdominal pain. Negative for nausea, blood in  stool and abdominal distention.  Genitourinary: Negative.   Musculoskeletal: Positive for back pain and arthralgias.  Neurological: Positive for numbness.  Hematological: Negative.   Psychiatric/Behavioral: Negative.     Blood pressure 118/66, pulse 64, temperature 97 F (36.1 C), temperature source Temporal, resp. rate 16, height 5\' 9"  (1.753 m), weight 205 lb (92.987 kg).  Physical Exam Physical Exam  Constitutional: He is oriented to person, place, and time. He appears well-developed and well-nourished. No distress.  HENT:  Head: Normocephalic and atraumatic.  Right Ear: External ear normal.  Left Ear: External ear normal.  Nose: Nose normal.  Mouth/Throat: Oropharynx is clear and moist. No oropharyngeal exudate.  Eyes: Conjunctivae and EOM are normal. Pupils are equal, round, and reactive to light. Left eye exhibits no discharge. No scleral icterus.  Neck: Normal range of motion. Neck supple. No tracheal deviation present. No thyromegaly present.  Cardiovascular: Normal rate, regular rhythm, normal heart sounds and intact distal pulses.   No  murmur heard. Pulmonary/Chest: Effort normal and breath sounds normal. No respiratory distress. He has no wheezes.  Abdominal: Soft. Bowel sounds are normal. He exhibits no distension. There is no tenderness. There is no rebound.       Easily reducible umbilical hernia is present. He also has a rectus diastases  Musculoskeletal: Normal range of motion. He exhibits no edema and no tenderness.  Lymphadenopathy:    He has no cervical adenopathy.  Neurological: He is alert and oriented to person, place, and time.  Skin: Skin is warm and dry. No rash noted. He is not diaphoretic. No erythema.  Psychiatric: His behavior is normal. Judgment normal.    Data Reviewed   Assessment    Patient was then easily reducible symptomatic umbilical hernia    Plan    Repair is recommended with mesh. I discussed this with him in detail. I discussed the risk of surgery which include but not limited to bleeding, infection, injury to surrounding structures, infection of the mesh, etc. He understands and wishes to proceed.       Karima Carrell A 09/17/2011, 10:25 AM

## 2011-09-17 NOTE — Telephone Encounter (Signed)
Per our record he received number [id number] #[id number] on July 31- not due to receive anymore until October 31. Pt informed

## 2011-10-03 ENCOUNTER — Other Ambulatory Visit: Payer: Self-pay | Admitting: Internal Medicine

## 2011-10-03 MED ORDER — AZITHROMYCIN 250 MG PO TABS: ORAL_TABLET | ORAL | Status: AC

## 2011-10-03 NOTE — Telephone Encounter (Signed)
Per dr jenkins-may have z pack 

## 2011-10-03 NOTE — Telephone Encounter (Signed)
Pt has another sinus inf requesting z-pak call into [facility] [phone].

## 2011-10-10 LAB — CBC
MCHC: 34
MCV: 95.3
Platelets: 261
WBC: 5.5

## 2011-10-10 LAB — COMPREHENSIVE METABOLIC PANEL
ALT: 24
Alkaline Phosphatase: 29 — ABNORMAL LOW
BUN: 12
CO2: 35 — ABNORMAL HIGH
Calcium: 9.6
GFR calc non Af Amer: >60
Glucose, Bld: 91
Sodium: 146 — ABNORMAL HIGH
Total Protein: 6.7

## 2011-10-10 LAB — PROTIME-INR
INR: 0.9
Prothrombin Time: 12.4

## 2011-11-21 ENCOUNTER — Ambulatory Visit: Payer: Medicare Other | Admitting: Internal Medicine

## 2011-12-17 ENCOUNTER — Telehealth: Payer: Self-pay | Admitting: Internal Medicine

## 2011-12-17 NOTE — Telephone Encounter (Signed)
Refill Oxycodone. Can pick up Thursday or Friday. Thanks.

## 2011-12-20 ENCOUNTER — Other Ambulatory Visit: Payer: Self-pay | Admitting: *Deleted

## 2011-12-20 MED ORDER — OXYCODONE HCL 15 MG PO TABS: 15.0000 mg | ORAL_TABLET | Freq: Four times a day (QID) | ORAL | Status: DC | PRN

## 2011-12-20 NOTE — Telephone Encounter (Signed)
Will call pt after dr Lovell Sheehan signs

## 2011-12-26 ENCOUNTER — Encounter (HOSPITAL_COMMUNITY): Payer: Self-pay | Admitting: Emergency Medicine

## 2011-12-26 ENCOUNTER — Telehealth (INDEPENDENT_AMBULATORY_CARE_PROVIDER_SITE_OTHER): Payer: Self-pay | Admitting: General Surgery

## 2011-12-26 ENCOUNTER — Emergency Department (HOSPITAL_COMMUNITY)
Admission: EM | Admit: 2011-12-26 | Discharge: 2011-12-26 | Disposition: A | Discharge status: Home / Self Care | Payer: Medicare Other | Attending: Emergency Medicine | Admitting: Emergency Medicine

## 2011-12-26 DIAGNOSIS — Z7982 Long term (current) use of aspirin: Secondary | ICD-10-CM | POA: Insufficient documentation

## 2011-12-26 DIAGNOSIS — M129 Arthropathy, unspecified: Secondary | ICD-10-CM | POA: Insufficient documentation

## 2011-12-26 DIAGNOSIS — E785 Hyperlipidemia, unspecified: Secondary | ICD-10-CM | POA: Insufficient documentation

## 2011-12-26 DIAGNOSIS — R10815 Periumbilic abdominal tenderness: Secondary | ICD-10-CM | POA: Insufficient documentation

## 2011-12-26 DIAGNOSIS — K429 Umbilical hernia without obstruction or gangrene: Secondary | ICD-10-CM | POA: Insufficient documentation

## 2011-12-26 DIAGNOSIS — R1033 Periumbilical pain: Secondary | ICD-10-CM | POA: Insufficient documentation

## 2011-12-26 DIAGNOSIS — I7300 Raynaud's syndrome without gangrene: Secondary | ICD-10-CM | POA: Insufficient documentation

## 2011-12-26 DIAGNOSIS — Z79899 Other long term (current) drug therapy: Secondary | ICD-10-CM | POA: Insufficient documentation

## 2011-12-26 NOTE — ED Provider Notes (Signed)
History     CSN: [id number]  Arrival date & time 12/26/11  4696   First MD Initiated Contact with Patient 12/26/11 2138      Chief Complaint  Patient presents with  . Hernia    umbilical-"protruding" w/redness and pain x 1 week.  No other sx's at present.    (Consider location/radiation/quality/duration/timing/severity/associated sxs/prior treatment) The history is provided by the patient.   patient has a history of umbilical hernia. He has seen Dr. Magnus Ivan for the same. He states the last couple days this got larger he can no longer do so. It is worse today. No fevers. No nausea vomiting diarrhea. He was supposed to have surgery November 12 and wanted to delay it until after the holidays. it has not changed color.   Past Medical History  Diagnosis Date  . Hyperlipidemia   . Psoriasis   . Low back pain   . Arthritis   . Raynaud disease   . Heart murmur   . Generalized headaches     with occasional migraines  . Hearing loss   . Leg swelling   . Trouble swallowing     Past Surgical History  Procedure Date  . Rotator cuff repair   . Cervical fusion     2 levels with nonunion    Family History  Problem Relation Age of Onset  . Prostate cancer    . Heart disease Father   . Diabetes Father   . Cancer Father     prostate    History  Substance Use Topics  . Smoking status: Former Smoker    Quit date: 09/16/1986  . Smokeless tobacco: Not on file  . Alcohol Use: No      Review of Systems  Constitutional: Negative for fatigue.  Respiratory: Negative for cough and shortness of breath.   Gastrointestinal: Negative for nausea, vomiting, abdominal pain and constipation.  Genitourinary: Negative for flank pain.    Allergies  Review of patient's allergies indicates no known allergies.  Home Medications   Current Outpatient Rx  Name Route Sig Dispense Refill  . ASPIRIN 81 MG PO TABS Oral Take 81 mg by mouth daily.      Marland Kitchen ETANERCEPT 50 MG/ML [place] SOLN  Subcutaneous Inject 50 mg into the skin once a week.      Marland Kitchen FLUOXETINE HCL 20 MG PO TABS Oral Take 20 mg by mouth daily.      Marland Kitchen GABAPENTIN 300 MG PO CAPS Oral Take 300 mg by mouth 3 (three) times daily.      . IBUPROFEN 200 MG PO TABS Oral Take 400 mg by mouth every 8 (eight) hours as needed. For pain.    Marland Kitchen MELATONIN 5 MG PO CAPS Oral Take 1 capsule by mouth at bedtime.      . MOMETASONE FUROATE 50 MCG/ACT NA SUSP Nasal Place 2 sprays into the nose daily as needed.     . MORPHINE SULFATE 15 MG PO TABS Oral Take 15 mg by mouth 2 (two) times daily as needed. For pain.    . ADULT MULTIVITAMIN W/MINERALS CH Oral Take 1 tablet by mouth daily.      . OXYCODONE HCL 15 MG PO TABS Oral Take 15 mg by mouth every 4 (four) hours as needed. For pain.     Marland Kitchen PREDNISONE 5 MG PO TABS Oral Take 5 mg by mouth daily.      Marland Kitchen RIZATRIPTAN BENZOATE 10 MG PO TABS Oral Take 10 mg by mouth as needed.  May repeat in 2 hours if needed for migraines.    Marland Kitchen ZOLPIDEM TARTRATE ER 12.5 MG PO TBCR Oral Take 12.5 mg by mouth at bedtime as needed. For sleep.      BP 122/73  Pulse 60  Temp 98 F (36.7 C)  Resp 16  Wt 195 lb (88.451 kg)  SpO2 99%  Physical Exam  Nursing note and vitals reviewed. Constitutional: He is oriented to person, place, and time. He appears well-developed and well-nourished.  HENT:  Head: Normocephalic and atraumatic.  Eyes: EOM are normal. Pupils are equal, round, and reactive to light.  Neck: Normal range of motion. Neck supple.  Cardiovascular: Normal rate, regular rhythm and normal heart sounds.   No murmur heard. Pulmonary/Chest: Effort normal and breath sounds normal.  Abdominal: Soft. Bowel sounds are normal. He exhibits mass. He exhibits no distension. There is no tenderness. There is no rebound and no guarding.       Umbilical hernia. Approximately 1.5 cm. Minimal tenderness. No erythema. Unable to reduce. Abdomen otherwise nontender  Musculoskeletal: Normal range of motion. He exhibits no  edema.  Neurological: He is alert and oriented to person, place, and time. No cranial nerve deficit.  Skin: Skin is warm and dry.  Psychiatric: He has a normal mood and affect.    ED Course  Procedures (including critical care time)  Labs Reviewed - No data to display No results found.   1. UMBILICAL HERNIA       MDM  Patient has umbilical hernia. It appears to be incarcerated but not strangulated. I doubt that his bowel more likely omentum or fat hernia. I discussed the case with Dr. Derrell Lolling. Patient will follow with Dr. Magnus Ivan in the clinic. He'll return for change or worsening of symptoms.        Juliet Rude. Rubin Payor, MD 12/26/11 [phone]

## 2011-12-26 NOTE — ED Notes (Signed)
Pt alert, nad, c/o umbilical hernia, self reduces, hx of hernia, seen specialist already r/t hernia, concerned about increasing size

## 2011-12-26 NOTE — ED Notes (Signed)
Pt in bathroom. Walked with no assistance. NAD.

## 2011-12-26 NOTE — Telephone Encounter (Signed)
Patient diagnosed with umbilical hernia a couple months ago and wanted to wait until after the Holidays to have this repaired. Patient has follow up with Dr Magnus Ivan 01/15/12. He is calling today to get in sooner because he has a bulge now that can not be reduced. The area is red and very painful. I spoke with Dr Magnus Ivan re: his symptoms and patient was instructed to go to the emergency department for evaluation.

## 2011-12-27 ENCOUNTER — Telehealth (INDEPENDENT_AMBULATORY_CARE_PROVIDER_SITE_OTHER): Payer: Self-pay | Admitting: Surgery

## 2011-12-27 NOTE — Telephone Encounter (Signed)
Pt called to r/s his appt that he already has scheduled for 01/15/12 for a sooner appt.  He went to the [facility] ER as instructed and was told to call us and schedule sx, so he is asking for a sooner appt or if he even has to see Dr. Dorthea Cove?  Can he just schedule sx, please call.

## 2011-12-27 NOTE — Telephone Encounter (Signed)
Called pt and moved him up on 01/01/12 and told pt if he has no BM and NV and can not get the hernia go back in to go the the ER

## 2012-01-01 ENCOUNTER — Ambulatory Visit (INDEPENDENT_AMBULATORY_CARE_PROVIDER_SITE_OTHER): Payer: Medicare Other | Admitting: Surgery

## 2012-01-01 ENCOUNTER — Encounter (HOSPITAL_COMMUNITY): Payer: Self-pay

## 2012-01-01 ENCOUNTER — Encounter (INDEPENDENT_AMBULATORY_CARE_PROVIDER_SITE_OTHER): Payer: Self-pay | Admitting: Surgery

## 2012-01-01 VITALS — BP 122/76 | HR 68 | Temp 97.8°F | Resp 16 | Ht 69.0 in | Wt 195.4 lb

## 2012-01-01 DIAGNOSIS — K429 Umbilical hernia without obstruction or gangrene: Secondary | ICD-10-CM

## 2012-01-01 NOTE — Progress Notes (Signed)
Patient ID: [name], male   DOB: 18-Jul-1946, 66 y.o.   MRN: [id number]  Chief Complaint  Patient presents with  . Umbilical Hernia    HPI Jesus Wallace is a 66 y.o. male.   HPI This is a gentleman that I originally saw in September of 2012 with a symptomatic umbilical hernia. At that time it was not incarcerated. He was going to have surgery on November 12 but had to cancel the surgery because he had to go to the [place]. He had been doing well, but the hernia has now become incarcerated. Other than mild discomfort he has no obstructive symptoms. He also has minimal tenderness. Past Medical History  Diagnosis Date  . Hyperlipidemia   . Psoriasis   . Low back pain   . Arthritis   . Raynaud disease   . Heart murmur   . Generalized headaches     with occasional migraines  . Hearing loss   . Leg swelling   . Trouble swallowing   . Umbilical hernia     Past Surgical History  Procedure Date  . Rotator cuff repair   . Cervical fusion     2 levels with nonunion    Family History  Problem Relation Age of Onset  . Prostate cancer    . Heart disease Father   . Diabetes Father   . Cancer Father     prostate    Social History History  Substance Use Topics  . Smoking status: Former Smoker    Quit date: 09/16/1986  . Smokeless tobacco: Never Used  . Alcohol Use: No    No Known Allergies  Current Outpatient Prescriptions  Medication Sig Dispense Refill  . aspirin 81 MG tablet Take 81 mg by mouth daily.        Marland Kitchen etanercept (ENBREL) 50 MG/ML injection Inject 50 mg into the skin once a week.        Marland Kitchen FLUoxetine (PROZAC) 20 MG tablet Take 20 mg by mouth daily.        Marland Kitchen gabapentin (NEURONTIN) 300 MG capsule Take 300 mg by mouth 3 (three) times daily.        Marland Kitchen ibuprofen (ADVIL,MOTRIN) 200 MG tablet Take 400 mg by mouth every 8 (eight) hours as needed. For pain.      . Melatonin 5 MG CAPS Take 1 capsule by mouth at bedtime.        . mometasone (NASONEX) 50 MCG/ACT nasal spray Place  2 sprays into the nose daily as needed.       Marland Kitchen morphine (MSIR) 15 MG tablet Take 15 mg by mouth 2 (two) times daily as needed. For pain.      . Multiple Vitamin (MULITIVITAMIN WITH MINERALS) TABS Take 1 tablet by mouth daily.        Marland Kitchen oxyCODONE (ROXICODONE) 15 MG immediate release tablet Take 15 mg by mouth every 4 (four) hours as needed. For pain.       . predniSONE (DELTASONE) 5 MG tablet Take 5 mg by mouth daily.        . rizatriptan (MAXALT) 10 MG tablet Take 10 mg by mouth as needed. May repeat in 2 hours if needed for migraines.      Marland Kitchen zolpidem (AMBIEN CR) 12.5 MG CR tablet Take 12.5 mg by mouth at bedtime as needed. For sleep.       Current Facility-Administered Medications  Medication Dose Route Frequency Provider Last Rate Last Dose  . methylPREDNISolone acetate (DEPO-MEDROL)  injection 20 mg  20 mg Intra-articular Once Carrie Mew        Review of Systems Review of SystemsHis review of systems is otherwise unremarkable. He has had no other change in his symptoms or in his medical history  Blood pressure 122/76, pulse 68, temperature 97.8 F (36.6 C), temperature source Temporal, resp. rate 16, height 5\' 9"  (1.753 m), weight 195 lb 6.4 oz (88.633 kg).  Physical Exam Physical Exam  Constitutional: He is oriented to person, place, and time. He appears well-developed and well-nourished. No distress.  HENT:  Head: Normocephalic and atraumatic.  Right Ear: External ear normal.  Left Ear: External ear normal.  Nose: Nose normal.  Mouth/Throat: Oropharynx is clear and moist. No oropharyngeal exudate.  Eyes: Conjunctivae are normal. Pupils are equal, round, and reactive to light. Right eye exhibits no discharge. Left eye exhibits no discharge. No scleral icterus.  Neck: Normal range of motion. Neck supple. No tracheal deviation present. No thyromegaly present.  Cardiovascular: Normal rate, regular rhythm, normal heart sounds and intact distal pulses.   No murmur  heard. Pulmonary/Chest: Effort normal and breath sounds normal. No respiratory distress. He has no wheezes. He has no rales.  Abdominal: Soft. Bowel sounds are normal. There is no tenderness. There is no guarding.       There is now a small incarcerated hernia at the umbilicus. There is no erythema. It is nontender.  Musculoskeletal: Normal range of motion. He exhibits no edema and no tenderness.  Lymphadenopathy:    He has no cervical adenopathy.  Neurological: He is alert and oriented to person, place, and time.  Skin: Skin is warm and dry. No rash noted. He is not diaphoretic. No erythema.  Psychiatric: His behavior is normal. Judgment normal.    Data Reviewed   Assessment    Incarcerated umbilical hernia    Plan    Of repair is recommended with possible mesh. I will write a note to the [place] regarding this. Again I discussed the risk of surgery with him in detail. I also discussed the possible use of mesh. He wishes to proceed. Likely this excess is good.       Jesus Wallace A 01/01/2012, 10:11 AM

## 2012-01-02 ENCOUNTER — Encounter (HOSPITAL_COMMUNITY): Payer: Self-pay

## 2012-01-02 ENCOUNTER — Encounter (HOSPITAL_COMMUNITY)
Admission: RE | Admit: 2012-01-02 | Discharge: 2012-01-02 | Disposition: A | Discharge status: Home / Self Care | Payer: Medicare Other | Source: Ambulatory Visit | Attending: Surgery | Admitting: Surgery

## 2012-01-02 HISTORY — DX: Nausea with vomiting, unspecified: R11.2

## 2012-01-02 HISTORY — DX: Other specified postprocedural states: Z98.890

## 2012-01-02 HISTORY — DX: Depression, unspecified: F32.A

## 2012-01-02 HISTORY — DX: Major depressive disorder, single episode, unspecified: F32.9

## 2012-01-02 LAB — CBC
Hemoglobin: 15.1 g/dL (ref 13.0–17.0)
Platelets: 222 10*3/uL (ref 150–400)
RBC: 4.55 MIL/uL (ref 4.22–5.81)
WBC: 7.4 10*3/uL (ref 4.0–10.5)

## 2012-01-02 LAB — BASIC METABOLIC PANEL
CO2: 30 mEq/L (ref 19–32)
Glucose, Bld: 91 mg/dL (ref 70–99)
Potassium: 4.8 mEq/L (ref 3.5–5.1)
Sodium: 137 mEq/L (ref 135–145)

## 2012-01-02 LAB — SURGICAL PCR SCREEN
MRSA, PCR: NEGATIVE
Staphylococcus aureus: NEGATIVE

## 2012-01-02 NOTE — Pre-Procedure Instructions (Signed)
20 PEARSON PICOU  01/02/2012   Your procedure is scheduled on:  Friday, Jan 11  Report to [facility] at 0845 AM.  Call this number if you have problems the morning of surgery: [phone]   Remember:   Do not eat food:After Midnight.  May have clear liquids: up to 4 Hours before arrival.  Clear liquids include soda, tea, black coffee, apple or grape juice, broth.  Take these medicines the morning of surgery with A SIP OF WATER: *Morphine, Oxycodone, Prednisone**   Do not wear jewelry, make-up or nail polish.  Do not wear lotions, powders, or perfumes. You may wear deodorant.  Do not shave 48 hours prior to surgery.  Do not bring valuables to the hospital.  Contacts, dentures or bridgework may not be worn into surgery.  Leave suitcase in the car. After surgery it may be brought to your room.  For patients admitted to the hospital, checkout time is 11:00 AM the day of discharge.   Patients discharged the day of surgery will not be allowed to drive home.  Name and phone number of your driver: *Spouse**  Special Instructions: CHG Shower Use Special Wash: 1/2 bottle night before surgery and 1/2 bottle morning of surgery.   Please read over the following fact sheets that you were given: Pain Booklet, Coughing and Deep Breathing, MRSA Information and Surgical Site Infection Prevention

## 2012-01-03 MED ORDER — CEFAZOLIN SODIUM-DEXTROSE 2-3 GM-% IV SOLR
2.0000 g | INTRAVENOUS | Status: AC
  Administered 2012-01-04: 2 g via INTRAVENOUS
  Filled 2012-01-03: qty 50

## 2012-01-04 ENCOUNTER — Encounter (HOSPITAL_COMMUNITY): Payer: Self-pay | Admitting: Surgery

## 2012-01-04 ENCOUNTER — Ambulatory Visit (HOSPITAL_COMMUNITY): Payer: Medicare Other | Admitting: Certified Registered"

## 2012-01-04 ENCOUNTER — Encounter (HOSPITAL_COMMUNITY): Payer: Self-pay | Admitting: Certified Registered"

## 2012-01-04 ENCOUNTER — Encounter (HOSPITAL_COMMUNITY)
Admission: RE | Disposition: A | Discharge status: Home / Self Care | Payer: Self-pay | Source: Ambulatory Visit | Attending: Surgery

## 2012-01-04 ENCOUNTER — Ambulatory Visit (HOSPITAL_COMMUNITY)
Admission: RE | Admit: 2012-01-04 | Discharge: 2012-01-04 | Disposition: A | Discharge status: Home / Self Care | Payer: Medicare Other | Source: Ambulatory Visit | Attending: Surgery | Admitting: Surgery

## 2012-01-04 DIAGNOSIS — K420 Umbilical hernia with obstruction, without gangrene: Secondary | ICD-10-CM | POA: Insufficient documentation

## 2012-01-04 DIAGNOSIS — K429 Umbilical hernia without obstruction or gangrene: Secondary | ICD-10-CM

## 2012-01-04 DIAGNOSIS — Z01812 Encounter for preprocedural laboratory examination: Secondary | ICD-10-CM | POA: Insufficient documentation

## 2012-01-04 DIAGNOSIS — K219 Gastro-esophageal reflux disease without esophagitis: Secondary | ICD-10-CM | POA: Insufficient documentation

## 2012-01-04 HISTORY — PX: UMBILICAL HERNIA REPAIR: SHX196

## 2012-01-04 SURGERY — REPAIR, HERNIA, UMBILICAL, ADULT
Anesthesia: General | Site: Abdomen | Wound class: Clean

## 2012-01-04 MED ORDER — DROPERIDOL 2.5 MG/ML IJ SOLN
INTRAMUSCULAR | Status: DC | PRN
  Administered 2012-01-04: 0.625 mg via INTRAVENOUS

## 2012-01-04 MED ORDER — KETOROLAC TROMETHAMINE 30 MG/ML IJ SOLN
INTRAMUSCULAR | Status: DC | PRN
  Administered 2012-01-04: 30 mg via INTRAVENOUS

## 2012-01-04 MED ORDER — PROMETHAZINE HCL 25 MG/ML IJ SOLN: 6.2500 mg | INTRAMUSCULAR | Status: DC | PRN

## 2012-01-04 MED ORDER — 0.9 % SODIUM CHLORIDE (POUR BTL) OPTIME
TOPICAL | Status: DC | PRN
  Administered 2012-01-04: 1000 mL

## 2012-01-04 MED ORDER — HYDROCODONE-ACETAMINOPHEN 5-325 MG PO TABS: 1.0000 | ORAL_TABLET | ORAL | Status: AC | PRN

## 2012-01-04 MED ORDER — MIDAZOLAM HCL 5 MG/5ML IJ SOLN
INTRAMUSCULAR | Status: DC | PRN
  Administered 2012-01-04: 2 mg via INTRAVENOUS

## 2012-01-04 MED ORDER — DEXAMETHASONE SODIUM PHOSPHATE 4 MG/ML IJ SOLN
INTRAMUSCULAR | Status: DC | PRN
  Administered 2012-01-04: 8 mg via INTRAVENOUS

## 2012-01-04 MED ORDER — HYDROMORPHONE HCL PF 1 MG/ML IJ SOLN
INTRAMUSCULAR | Status: AC
  Administered 2012-01-04: 0.25 mg via INTRAVENOUS
  Filled 2012-01-04: qty 1

## 2012-01-04 MED ORDER — HYDROMORPHONE HCL PF 1 MG/ML IJ SOLN
0.2500 mg | INTRAMUSCULAR | Status: DC | PRN
  Administered 2012-01-04 (×2): 0.5 mg via INTRAVENOUS
  Administered 2012-01-04 (×2): 0.25 mg via INTRAVENOUS

## 2012-01-04 MED ORDER — PROPOFOL 10 MG/ML IV EMUL
INTRAVENOUS | Status: DC | PRN
  Administered 2012-01-04: 200 mL via INTRAVENOUS

## 2012-01-04 MED ORDER — BUPIVACAINE LIPOSOME 1.3 % IJ SUSP
20.0000 mL | INTRAMUSCULAR | Status: DC
  Filled 2012-01-04: qty 20

## 2012-01-04 MED ORDER — FENTANYL CITRATE 0.05 MG/ML IJ SOLN
INTRAMUSCULAR | Status: DC | PRN
  Administered 2012-01-04 (×2): 25 ug via INTRAVENOUS
  Administered 2012-01-04: 150 ug via INTRAVENOUS

## 2012-01-04 MED ORDER — LACTATED RINGERS IV SOLN: INTRAVENOUS | Status: DC

## 2012-01-04 MED ORDER — ONDANSETRON HCL 4 MG/2ML IJ SOLN
INTRAMUSCULAR | Status: DC | PRN
  Administered 2012-01-04: 4 mg via INTRAVENOUS

## 2012-01-04 MED ORDER — LACTATED RINGERS IV SOLN
INTRAVENOUS | Status: DC | PRN
  Administered 2012-01-04 (×2): via INTRAVENOUS

## 2012-01-04 MED ORDER — BUPIVACAINE LIPOSOME 1.3 % IJ SUSP
INTRAMUSCULAR | Status: DC | PRN
  Administered 2012-01-04: 20 mL

## 2012-01-04 SURGICAL SUPPLY — 36 items
APL SKNCLS STERI-STRIP NONHPOA (GAUZE/BANDAGES/DRESSINGS) ×1
BENZOIN TINCTURE PRP APPL 2/3 (GAUZE/BANDAGES/DRESSINGS) ×2 IMPLANT
BLADE SURG ROTATE 9660 (MISCELLANEOUS) IMPLANT
CANISTER SUCTION 2500CC (MISCELLANEOUS) IMPLANT
CHLORAPREP W/TINT 26ML (MISCELLANEOUS) ×2 IMPLANT
CLOTH BEACON ORANGE TIMEOUT ST (SAFETY) ×2 IMPLANT
COVER SURGICAL LIGHT HANDLE (MISCELLANEOUS) ×2 IMPLANT
DECANTER SPIKE VIAL GLASS SM (MISCELLANEOUS) IMPLANT
DRAPE PED LAPAROTOMY (DRAPES) ×2 IMPLANT
DRSG TEGADERM 4X4.75 (GAUZE/BANDAGES/DRESSINGS) ×2 IMPLANT
ELECT REM PT RETURN 9FT ADLT (ELECTROSURGICAL) ×2
ELECTRODE REM PT RTRN 9FT ADLT (ELECTROSURGICAL) ×1 IMPLANT
GAUZE SPONGE 2X2 8PLY STRL LF (GAUZE/BANDAGES/DRESSINGS) ×1 IMPLANT
GLOVE BIOGEL PI IND STRL 7.0 (GLOVE) ×1 IMPLANT
GLOVE BIOGEL PI IND STRL 7.5 (GLOVE) ×1 IMPLANT
GLOVE BIOGEL PI INDICATOR 7.0 (GLOVE) ×1
GLOVE BIOGEL PI INDICATOR 7.5 (GLOVE) ×1
GLOVE SURG SIGNA 7.5 PF LTX (GLOVE) ×2 IMPLANT
GLOVE SURG SS PI 6.5 STRL IVOR (GLOVE) ×2 IMPLANT
GOWN PREVENTION PLUS XLARGE (GOWN DISPOSABLE) ×2 IMPLANT
GOWN STRL NON-REIN LRG LVL3 (GOWN DISPOSABLE) ×2 IMPLANT
KIT BASIN OR (CUSTOM PROCEDURE TRAY) ×2 IMPLANT
KIT ROOM TURNOVER OR (KITS) ×2 IMPLANT
NEEDLE HYPO 25GX1X1/2 BEV (NEEDLE) ×2 IMPLANT
NS IRRIG 1000ML POUR BTL (IV SOLUTION) ×2 IMPLANT
PACK GENERAL/GYN (CUSTOM PROCEDURE TRAY) ×2 IMPLANT
PAD ARMBOARD 7.5X6 YLW CONV (MISCELLANEOUS) ×2 IMPLANT
SPONGE GAUZE 2X2 STER 10/PKG (GAUZE/BANDAGES/DRESSINGS) ×1
STRIP CLOSURE SKIN 1/2X4 (GAUZE/BANDAGES/DRESSINGS) ×2 IMPLANT
SUT MNCRL AB 4-0 PS2 18 (SUTURE) ×2 IMPLANT
SUT NOVA NAB DX-16 0-1 5-0 T12 (SUTURE) ×2 IMPLANT
SUT VIC AB 3-0 SH 27 (SUTURE) ×2
SUT VIC AB 3-0 SH 27X BRD (SUTURE) ×1 IMPLANT
SYR CONTROL 10ML LL (SYRINGE) ×2 IMPLANT
TOWEL OR 17X24 6PK STRL BLUE (TOWEL DISPOSABLE) ×2 IMPLANT
TOWEL OR 17X26 10 PK STRL BLUE (TOWEL DISPOSABLE) ×2 IMPLANT

## 2012-01-04 NOTE — Anesthesia Postprocedure Evaluation (Signed)
  Anesthesia Post-op Note  Patient: Jesus Wallace  Procedure(s) Performed:  HERNIA REPAIR UMBILICAL ADULT - umbilical hernia repair   Patient Location: PACU  Anesthesia Type: General  Level of Consciousness: awake, alert  and oriented  Airway and Oxygen Therapy: Patient Spontanous Breathing  Post-op Pain: none  Post-op Assessment: Post-op Vital signs reviewed, Patient's Cardiovascular Status Stable, Respiratory Function Stable, Patent Airway, No signs of Nausea or vomiting and Pain level controlled  Post-op Vital Signs: Reviewed and stable  Complications: No apparent anesthesia complications

## 2012-01-04 NOTE — Anesthesia Preprocedure Evaluation (Addendum)
Anesthesia Evaluation  Patient identified by MRN, date of birth, ID band Patient awake    Reviewed: Allergy & Precautions, H&P , NPO status , Patient's Chart, lab work & pertinent test results, reviewed documented beta blocker date and time   History of Anesthesia Complications (+) PONV  Airway Mallampati: II TM Distance: >3 FB Neck ROM: Full    Dental No notable dental hx. (+) Teeth Intact, Caps and Dental Advisory Given   Pulmonary neg pulmonary ROS,  clear to auscultation  Pulmonary exam normal       Cardiovascular neg cardio ROS + Valvular Problems/Murmurs (h/o murmur, ECHO 2 years ago totally normal as per patient) Regular Normal+ Systolic murmurs (II/VI SEM)    Neuro/Psych  Headaches (migraine once/month), Depression  Neuromuscular disease (chronic back pain- narcotics daily)    GI/Hepatic Neg liver ROS, GERD-  Controlled,  Endo/Other  Negative Endocrine ROS  Renal/GU negative Renal ROS     Musculoskeletal   Abdominal   Peds  Hematology   Anesthesia Other Findings   Reproductive/Obstetrics                          Anesthesia Physical Anesthesia Plan  ASA: II  Anesthesia Plan: General   Post-op Pain Management:    Induction: Intravenous  Airway Management Planned: LMA  Additional Equipment:   Intra-op Plan:   Post-operative Plan:   Informed Consent: I have reviewed the patients History and Physical, chart, labs and discussed the procedure including the risks, benefits and alternatives for the proposed anesthesia with the patient or authorized representative who has indicated his/her understanding and acceptance.   Dental advisory given  Plan Discussed with: CRNA and Surgeon  Anesthesia Plan Comments: (Plan routine monitors, GA- LMA OK)        Anesthesia Quick Evaluation

## 2012-01-04 NOTE — Op Note (Signed)
01/04/2012  11:23 AM  PATIENT:  Jesus Wallace  66 y.o. male  PRE-OPERATIVE DIAGNOSIS:  umbilical hernia  POST-OPERATIVE DIAGNOSIS:  umbilical hernia  PROCEDURE:  Procedure(s): HERNIA REPAIR UMBILICAL ADULT  SURGEON:  Surgeon(s): Shelly Rubenstein, MD  PHYSICIAN ASSISTANT:   ASSISTANTS: none   ANESTHESIA:   local and general  EBL:  Total I/O In: 1000 [I.V.:1000] Out: 20 [Blood:20]  BLOOD ADMINISTERED:none  DRAINS: none   LOCAL MEDICATIONS USED:  BUPIVICAINE 20CC  SPECIMEN:  No Specimen  DISPOSITION OF SPECIMEN:  N/A  COUNTS:  YES  TOURNIQUET:  * No tourniquets in log *  DICTATION: .Dragon Dictation Indications: This is a gentleman with a chronically incarcerated umbilical hernia containing omentum. The decision was made to proceed with repair.  Procedure: The patient was brought to the operating room and identified as the correct patient. He was placed supine on the operating table and general anesthesia was induced. His abdomen was then prepped and draped in the usual sterile fashion. Using a #15 blade scalpel a small vertical incision was made at the upper edge of the umbilicus at the midline. This was made over the chronically incarcerated hernia. I did this down to the subcutaneous tissue with the electrocautery. The hernia sac was then dissected circumferentially down to fascia. I then excised the sac in its entirety along with a small amount of omentum that was contained within the sac. No bowel was involved with the sac. The fascial defect itself was less than 1 cm. At this point given the small amount of erythema in the skin and the small size of the hernia I elected to repair it primarily. I closed the fascia defect easily with several figure-of-eight #1 Novafil sutures. Good closure appeared to be achieved. I then anesthetized the surrounding fascia and subcutaneous tissue with bupivacaine. I closed subcutaneous tissue with interrupted 3-0 Vicryl sutures and closed  the skin with a running 4-0 Monocryl. Steri-Strips gauze and tape were then applied. The patient tolerated the procedure well. All counts were correct at the end of the procedure. The patient and asked him in the operating room and taken in stable condition to the recovery room. PLAN OF CARE: Discharge to home after PACU  PATIENT DISPOSITION:  PACU - hemodynamically stable.   Delay start of Pharmacological VTE agent (>24hrs) due to surgical blood loss or risk of bleeding:  {YES/NO/NOT APPLICABLE:20182

## 2012-01-04 NOTE — Transfer of Care (Signed)
Immediate Anesthesia Transfer of Care Note  Patient: Jesus Wallace  Procedure(s) Performed:  HERNIA REPAIR UMBILICAL ADULT - umbilical hernia repair   Patient Location: PACU  Anesthesia Type: General  Level of Consciousness: awake, alert , oriented and patient cooperative  Airway & Oxygen Therapy: Patient Spontanous Breathing and Patient connected to nasal cannula oxygen  Post-op Assessment: Report given to PACU RN, Post -op Vital signs reviewed and stable and Patient moving all extremities  Post vital signs: Reviewed and stable Filed Vitals:   01/04/12 0913  BP: 139/73  Pulse: 54  Temp: 36.7 C  Resp: 18    Complications: No apparent anesthesia complications

## 2012-01-04 NOTE — Preoperative (Signed)
Beta Blockers   Reason not to administer Beta Blockers:Not Applicable 

## 2012-01-04 NOTE — Interval H&P Note (Signed)
History and Physical Interval Note:  Has has had no change in his history or exam  01/04/2012 5:04 AM  Jesus Wallace  has presented today for surgery, with the diagnosis of umbilical hernia  The various methods of treatment have been discussed with the patient and family. After consideration of risks, benefits and other options for treatment, the patient has consented to  Procedure(s): HERNIA REPAIR UMBILICAL ADULT WITH POSSIBLE MESH as a surgical intervention .  The patients' history has been reviewed, patient examined, no change in status, stable for surgery.  I have reviewed the patients' chart and labs.  Questions were answered to the patient's satisfaction.     Avamarie Crossley A

## 2012-01-04 NOTE — H&P (View-Only) (Signed)
Patient ID: Jesus Wallace, male   DOB: 02/28/1946, 65 y.o.   MRN: [id number]  Chief Complaint  Patient presents with  . Umbilical Hernia    HPI Jesus Wallace is a 65 y.o. male.   HPI This is a gentleman that I originally saw in September of 2012 with a symptomatic umbilical hernia. At that time it was not incarcerated. He was going to have surgery on November 12 but had to cancel the [facility] He had been doing well, but the hernia has now become incarcerated. Other than mild discomfort he has no obstructive symptoms. He also has minimal tenderness. Past Medical History  Diagnosis Date  . Hyperlipidemia   . Psoriasis   . Low back pain   . Arthritis   . Raynaud disease   . Heart murmur   . Generalized headaches     with occasional migraines  . Hearing loss   . Leg swelling   . Trouble swallowing   . Umbilical hernia     Past Surgical History  Procedure Date  . Rotator cuff repair   . Cervical fusion     2 levels with nonunion    Family History  Problem Relation Age of Onset  . Prostate cancer    . Heart disease Father   . Diabetes Father   . Cancer Father     prostate    Social History History  Substance Use Topics  . Smoking status: Former Smoker    Quit date: 09/16/1986  . Smokeless tobacco: Never Used  . Alcohol Use: No    No Known Allergies  Current Outpatient Prescriptions  Medication Sig Dispense Refill  . aspirin 81 MG tablet Take 81 mg by mouth daily.        . etanercept (ENBREL) 50 MG/ML injection Inject 50 mg into the skin once a week.        . FLUoxetine (PROZAC) 20 MG tablet Take 20 mg by mouth daily.        . gabapentin (NEURONTIN) 300 MG capsule Take 300 mg by mouth 3 (three) times daily.        . ibuprofen (ADVIL,MOTRIN) 200 MG tablet Take 400 mg by mouth every 8 (eight) hours as needed. For pain.      . Melatonin 5 MG CAPS Take 1 capsule by mouth at bedtime.        . mometasone (NASONEX) 50 MCG/ACT nasal spray Place  2 sprays into the nose daily as needed.       . morphine (MSIR) 15 MG tablet Take 15 mg by mouth 2 (two) times daily as needed. For pain.      . Multiple Vitamin (MULITIVITAMIN WITH MINERALS) TABS Take 1 tablet by mouth daily.        . oxyCODONE (ROXICODONE) 15 MG immediate release tablet Take 15 mg by mouth every 4 (four) hours as needed. For pain.       . predniSONE (DELTASONE) 5 MG tablet Take 5 mg by mouth daily.        . rizatriptan (MAXALT) 10 MG tablet Take 10 mg by mouth as needed. May repeat in 2 hours if needed for migraines.      . zolpidem (AMBIEN CR) 12.5 MG CR tablet Take 12.5 mg by mouth at bedtime as needed. For sleep.       Current Facility-Administered Medications  Medication Dose Route Frequency Provider Last Rate Last Dose  . methylPREDNISolone acetate (DEPO-MEDROL)   injection 20 mg  20 mg Intra-articular Once John Edward Jenkins        Review of Systems Review of SystemsHis review of systems is otherwise unremarkable. He has had no other change in his symptoms or in his medical history  Blood pressure 122/76, pulse 68, temperature 97.8 F (36.6 C), temperature source Temporal, resp. rate 16, height 5' 9" (1.753 m), weight 195 lb 6.4 oz (88.633 kg).  Physical Exam Physical Exam  Constitutional: He is oriented to person, place, and time. He appears well-developed and well-nourished. No distress.  HENT:  Head: Normocephalic and atraumatic.  Right Ear: External ear normal.  Left Ear: External ear normal.  Nose: Nose normal.  Mouth/Throat: Oropharynx is clear and moist. No oropharyngeal exudate.  Eyes: Conjunctivae are normal. Pupils are equal, round, and reactive to light. Right eye exhibits no discharge. Left eye exhibits no discharge. No scleral icterus.  Neck: Normal range of motion. Neck supple. No tracheal deviation present. No thyromegaly present.  Cardiovascular: Normal rate, regular rhythm, normal heart sounds and intact distal pulses.   No murmur  heard. Pulmonary/Chest: Effort normal and breath sounds normal. No respiratory distress. He has no wheezes. He has no rales.  Abdominal: Soft. Bowel sounds are normal. There is no tenderness. There is no guarding.       There is now a small incarcerated hernia at the umbilicus. There is no erythema. It is nontender.  Musculoskeletal: Normal range of motion. He exhibits no edema and no tenderness.  Lymphadenopathy:    He has no cervical adenopathy.  Neurological: He is alert and oriented to person, place, and time.  Skin: Skin is warm and dry. No rash noted. He is not diaphoretic. No erythema.  Psychiatric: His behavior is normal. Judgment normal.    Data Reviewed   Assessment    Incarcerated umbilical hernia    Plan    Of repair is recommended with possible mesh. [facility]. Again I discussed the risk of surgery with him in detail. I also discussed the possible use of mesh. He wishes to proceed. Likely this excess is good.       Susane Bey A 01/01/2012, 10:11 AM    

## 2012-01-07 ENCOUNTER — Encounter (HOSPITAL_COMMUNITY): Payer: Self-pay | Admitting: Surgery

## 2012-01-15 ENCOUNTER — Encounter (INDEPENDENT_AMBULATORY_CARE_PROVIDER_SITE_OTHER): Payer: Medicare Other | Admitting: Surgery

## 2012-01-16 ENCOUNTER — Ambulatory Visit (INDEPENDENT_AMBULATORY_CARE_PROVIDER_SITE_OTHER): Payer: Medicare Other | Admitting: Surgery

## 2012-01-16 ENCOUNTER — Encounter (INDEPENDENT_AMBULATORY_CARE_PROVIDER_SITE_OTHER): Payer: Self-pay | Admitting: Surgery

## 2012-01-16 VITALS — BP 128/70 | HR 68 | Temp 98.6°F | Resp 18 | Ht 69.0 in | Wt 196.2 lb

## 2012-01-16 DIAGNOSIS — Z09 Encounter for follow-up examination after completed treatment for conditions other than malignant neoplasm: Secondary | ICD-10-CM

## 2012-01-16 NOTE — Progress Notes (Signed)
Subjective:     Patient ID: Jesus Wallace, male   DOB: [birth year], 66 y.o.   MRN: [id number]  HPI He is here for his first postoperative visit status post umbilical hernia repair. He is doing well and has minimal discomfort. He is only concerned about the scar  Review of Systems     Objective:   Physical Exam On exam, there is no evidence for recurrence. There is no evidence of infection. There is prominence of the scar with swelling still    Assessment:     Patient status post umbilical hernia repair    Plan:     I told him to her frame from heavy lifting for 2 more weeks. He can start using scar creams on the scar. I told him it may take some time for this to improve. I will see him back as needed. I told him that he would have to wait on the scar least a year to resolve

## 2012-03-17 ENCOUNTER — Telehealth: Payer: Self-pay | Admitting: Internal Medicine

## 2012-03-17 NOTE — Telephone Encounter (Signed)
Patient called stating that he need refill on his oxycodone and that he usually gets 3 refills. Please assist.

## 2012-03-18 MED ORDER — OXYCODONE HCL 15 MG PO TABS: 15.0000 mg | ORAL_TABLET | ORAL | Status: DC | PRN

## 2012-03-18 NOTE — Telephone Encounter (Signed)
Pt called to check status of refill.  

## 2012-03-18 NOTE — Telephone Encounter (Signed)
rx up front ready for p/u, pt aware 

## 2012-03-18 NOTE — Telephone Encounter (Signed)
30 day only

## 2012-03-18 NOTE — Telephone Encounter (Signed)
Will you approve 1 mth?  Pt will be out on Saturday

## 2012-03-24 ENCOUNTER — Other Ambulatory Visit: Payer: Self-pay | Admitting: Internal Medicine

## 2012-03-24 NOTE — Telephone Encounter (Signed)
Pt stated he picked up wrong oxycodone rx. Pt will return rx. Pt needs oxycodone 15 mg

## 2012-03-25 ENCOUNTER — Other Ambulatory Visit: Payer: Self-pay | Admitting: *Deleted

## 2012-03-25 MED ORDER — OXYCODONE HCL 15 MG PO TABS: 15.0000 mg | ORAL_TABLET | Freq: Four times a day (QID) | ORAL | Status: DC | PRN

## 2012-03-25 NOTE — Telephone Encounter (Signed)
The tablet amount was wrong - should have been 3 scripts with #90--reprinted and pt brought wrong med

## 2012-03-25 NOTE — Telephone Encounter (Signed)
Pt is calling back checking on statue of med

## 2012-06-16 ENCOUNTER — Other Ambulatory Visit: Payer: Self-pay | Admitting: Internal Medicine

## 2012-06-16 MED ORDER — OXYCODONE HCL 15 MG PO TABS: 15.0000 mg | ORAL_TABLET | Freq: Four times a day (QID) | ORAL | Status: DC | PRN

## 2012-06-16 NOTE — Telephone Encounter (Signed)
2 script printed -pt informed may pick up in am ,but needs ov before anymlre refills

## 2012-06-16 NOTE — Telephone Encounter (Signed)
Pt needs new rx oxycodone 15mg  #90. Pt is requesting 3 separate rx for next 3 months

## 2012-07-30 ENCOUNTER — Ambulatory Visit (INDEPENDENT_AMBULATORY_CARE_PROVIDER_SITE_OTHER): Payer: Medicare Other | Admitting: Internal Medicine

## 2012-07-30 ENCOUNTER — Encounter: Payer: Self-pay | Admitting: Internal Medicine

## 2012-07-30 VITALS — BP 120/70 | HR 72 | Temp 98.2°F | Resp 16 | Ht 69.0 in | Wt 196.0 lb

## 2012-07-30 DIAGNOSIS — M546 Pain in thoracic spine: Secondary | ICD-10-CM

## 2012-07-30 DIAGNOSIS — F39 Unspecified mood [affective] disorder: Secondary | ICD-10-CM

## 2012-07-30 DIAGNOSIS — N429 Disorder of prostate, unspecified: Secondary | ICD-10-CM

## 2012-07-30 DIAGNOSIS — M545 Low back pain: Secondary | ICD-10-CM

## 2012-07-30 MED ORDER — OXYCODONE HCL 15 MG PO TABS: 15.0000 mg | ORAL_TABLET | Freq: Four times a day (QID) | ORAL | Status: DC | PRN

## 2012-07-30 NOTE — Progress Notes (Signed)
Subjective:    Patient ID: Jesus Wallace, male    DOB: [birth year], 66 y.o.   MRN: [id number]  HPI Patient is a 66 year old male who is followed for chronic pain management.  We signed a pain contract today to continue his pain medicine per our protocol.  He has been stable her current medication he has not required early refill he has done well with protocol his blood pressure is stable and he is tolerating the medications without complications.  He was recently treated in the [facility] for a low-grade prostate infection he was given doxycycline 100 mg capsules by mouth twice a day he does have a history of benign prostatic hypertrophy today he is afebrile his symptoms have improved from the standpoint of his prostate   Review of Systems  Constitutional: Negative for fever and fatigue.  HENT: Negative for hearing loss, congestion, neck pain and postnasal drip.   Eyes: Negative for discharge, redness and visual disturbance.  Respiratory: Negative for cough, shortness of breath and wheezing.   Cardiovascular: Negative for leg swelling.  Gastrointestinal: Negative for abdominal pain, constipation and abdominal distention.  Genitourinary: Positive for urgency and decreased urine volume. Negative for frequency.  Musculoskeletal: Positive for myalgias, back pain and arthralgias. Negative for joint swelling.  Skin: Negative for color change and rash.  Neurological: Negative for weakness and light-headedness.  Hematological: Negative for adenopathy.  Psychiatric/Behavioral: Negative for behavioral problems.   Past Medical History  Diagnosis Date  . Hyperlipidemia   . Psoriasis   . Low back pain   . Raynaud disease   . Generalized headaches     with occasional migraines  . Hearing loss   . Leg swelling   . Trouble swallowing   . Umbilical hernia   . PONV (postoperative nausea and vomiting)   . Heart murmur     asymptomatic  . Arthritis     osteoarthritis  . Neuralgia     takes  gabapentin for numbness  . 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 Prostatitis, unspecified 2011  . Depression     History   Social History  . Marital Status: Married    Spouse Name: N/A    Number of Children: N/A  . Years of Education: N/A   Occupational History  . Not on file.   Social History Main Topics  . Smoking status: Former Smoker -- 1.0 packs/day for 30 years    Types: Cigarettes    Quit date: 09/16/1986  . Smokeless tobacco: Never Used  . Alcohol Use: No  . Drug Use: No  . Sexually Active: Yes   Other Topics Concern  . Not on file   Social History Narrative  . No narrative on file    Past Surgical History  Procedure Date  . Rotator cuff repair   . Cervical fusion     2 levels with nonunion  . Irrigation and debridement sebaceous cyst 2003  . Umbilical hernia repair 01/04/2012    Procedure: HERNIA REPAIR UMBILICAL ADULT;  Surgeon: Shelly Rubenstein, MD;  Location: [facility] OR;  Service: General;  Laterality: N/A;  umbilical hernia repair     Family History  Problem Relation Age of Onset  . Prostate cancer    . Heart disease Father   . Diabetes Father   . Cancer Father     prostate    Allergies  Allergen Reactions  . Other Other (See Comments)    Pt had an anesthesia  medication that caused nausea and sick on stomach.    Current Outpatient Prescriptions on File Prior to Visit  Medication Sig Dispense Refill  . aspirin 81 MG tablet Take 81 mg by mouth daily.        . Calcium Carbonate-Vitamin D (CALCIUM PLUS VITAMIN D PO) Take 1 tablet by mouth daily.        Marland Kitchen etanercept (ENBREL) 50 MG/ML injection Inject 50 mg into the skin once a week. sunday      . FLUoxetine (PROZAC) 20 MG tablet Take 20 mg by mouth daily.        . folic acid (FOLVITE) 1 MG tablet Take 1 mg by mouth daily.        Marland Kitchen gabapentin (NEURONTIN) 300 MG capsule Take 300 mg by mouth 3 (three) times daily.        . Ginger, Zingiber officinalis, (GINGER ROOT) 550 MG CAPS Take 1  capsule by mouth daily.        Chilton Si Tea, Camillia sinensis, 315 MG CAPS Take 1 capsule by mouth daily.        Marland Kitchen ibuprofen (ADVIL,MOTRIN) 200 MG tablet Take 400 mg by mouth every 8 (eight) hours as needed. For pain.      . Lutein 40 MG CAPS Take 1 capsule by mouth every morning.        . Melatonin 5 MG CAPS Take 1 capsule by mouth at bedtime.        . mometasone (NASONEX) 50 MCG/ACT nasal spray Place 2 sprays into the nose daily as needed. For stuffy nose.      . morphine (MSIR) 15 MG tablet Take 15 mg by mouth 2 (two) times daily as needed. For pain.      . Multiple Vitamin (MULITIVITAMIN WITH MINERALS) TABS Take 1 tablet by mouth daily.        . Omega-3 Fatty Acids (FISH OIL) 1200 MG CAPS Take 1 capsule by mouth every evening.        Marland Kitchen OVER THE COUNTER MEDICATION Take 1 tablet by mouth daily. Zyatonix 400 mg       . predniSONE (DELTASONE) 5 MG tablet Take 5 mg by mouth daily.        . Turmeric 500 MG CAPS Take 1 capsule by mouth daily.        . vitamin B-12 (CYANOCOBALAMIN) 1000 MCG tablet Take 1,000 mcg by mouth daily.        . vitamin C (ASCORBIC ACID) 500 MG tablet Take 500 mg by mouth daily.        Marland Kitchen zolpidem (AMBIEN CR) 12.5 MG CR tablet Take 6.25-9.375 mg by mouth at bedtime as needed. For sleep.       Current Facility-Administered Medications on File Prior to Visit  Medication Dose Route Frequency Provider Last Rate Last Dose  . methylPREDNISolone acetate (DEPO-MEDROL) injection 20 mg  20 mg Intra-articular Once Stacie Glaze, MD        BP 120/70  Pulse 72  Temp 98.2 F (36.8 C)  Resp 16  Ht 5\' 9"  (1.753 m)  Wt 196 lb (88.905 kg)  BMI 28.94 kg/m2       Objective:   Physical Exam  Nursing note and vitals reviewed. Constitutional: He appears well-developed and well-nourished.  HENT:  Head: Normocephalic and atraumatic.  Eyes: Conjunctivae are normal. Pupils are equal, round, and reactive to light.  Neck: Normal range of motion. Neck supple.  Cardiovascular: Normal  rate and regular rhythm.  Pulmonary/Chest: Effort normal and breath sounds normal.  Abdominal: Soft. Bowel sounds are normal.          Assessment & Plan:  This is stable on current brain protocol refills given for 90 days.  History of migraine headaches.  Peripheral neuropathy with chronic foot pain.  Continue use with the disability and inability to perform work past due to neuropathy back pain and history of chronic migraine.  Patient should present in 6 months for a Medicare wellness exam

## 2012-11-21 ENCOUNTER — Telehealth: Payer: Self-pay | Admitting: Internal Medicine

## 2012-11-21 MED ORDER — OXYCODONE HCL 15 MG PO TABS
15.0000 mg | ORAL_TABLET | Freq: Four times a day (QID) | ORAL | Status: DC | PRN
Start: 2012-11-21 — End: 2012-11-25

## 2012-11-21 NOTE — Telephone Encounter (Signed)
Only approve 1 week supply.  Patient needs to follow up with Dr. Lovell Sheehan for refills

## 2012-11-21 NOTE — Telephone Encounter (Signed)
Pt is aware rx is ready for pick up. 

## 2012-11-21 NOTE — Telephone Encounter (Signed)
Pt needs new rx oxycodone 15 mg #90. Pt has enough for sat only. Pt is aware MD out of until tuesday

## 2012-11-21 NOTE — Telephone Encounter (Signed)
Okay to fill? 

## 2012-11-25 ENCOUNTER — Other Ambulatory Visit: Payer: Self-pay | Admitting: Internal Medicine

## 2012-11-25 MED ORDER — OXYCODONE HCL 15 MG PO TABS
15.0000 mg | ORAL_TABLET | Freq: Four times a day (QID) | ORAL | Status: DC | PRN
Start: 2012-11-25 — End: 2012-11-25

## 2012-11-25 MED ORDER — OXYCODONE HCL 15 MG PO TABS: 15.0000 mg | ORAL_TABLET | Freq: Four times a day (QID) | ORAL | Status: DC | PRN

## 2012-11-25 NOTE — Telephone Encounter (Signed)
Printed and will call pt when dr Lovell Sheehan signs

## 2012-11-25 NOTE — Telephone Encounter (Signed)
Pt called Fri for refill oxycodone 15mg .  Refill was given for only one week supply to get him through until MD returned. Pt would like regular refill of 90 days. He can be reached on mobile at [phone]

## 2012-11-26 ENCOUNTER — Telehealth: Payer: Self-pay | Admitting: Internal Medicine

## 2012-11-26 NOTE — Telephone Encounter (Signed)
Pt called to check on status or rx for oxyCODONE (ROXICODONE) 15 MG immediate release tablet. Pls call when ready for pick up.

## 2012-11-26 NOTE — Telephone Encounter (Signed)
Left message on machine Ready for pick up 

## 2012-12-23 ENCOUNTER — Telehealth: Payer: Self-pay | Admitting: *Deleted

## 2012-12-23 NOTE — Telephone Encounter (Signed)
Pt calls requesting DOT form completed-pt is on disability and takes 2 form of morphine-per dr Lovell Sheehan, cannt get dot form signed while on so much narcotic. It is not safe

## 2013-02-05 ENCOUNTER — Encounter: Payer: Medicare Other | Admitting: Internal Medicine

## 2013-02-13 ENCOUNTER — Other Ambulatory Visit: Payer: Self-pay | Admitting: *Deleted

## 2013-02-13 ENCOUNTER — Telehealth: Payer: Self-pay | Admitting: Internal Medicine

## 2013-02-13 NOTE — Telephone Encounter (Signed)
Left message on machine Too earlyl not due until 3-3--please call back

## 2013-02-13 NOTE — Telephone Encounter (Signed)
done

## 2013-02-13 NOTE — Telephone Encounter (Signed)
Pt needs refill of oxyCODONE (ROXICODONE) 15 MG immediate release tablet  

## 2013-03-25 ENCOUNTER — Encounter: Payer: Self-pay | Admitting: Internal Medicine

## 2013-03-25 ENCOUNTER — Ambulatory Visit (INDEPENDENT_AMBULATORY_CARE_PROVIDER_SITE_OTHER): Payer: Medicare Other | Admitting: Internal Medicine

## 2013-03-25 VITALS — BP 116/70 | HR 50 | Temp 97.9°F | Resp 16 | Ht 69.0 in | Wt 194.0 lb

## 2013-03-25 DIAGNOSIS — Z23 Encounter for immunization: Secondary | ICD-10-CM

## 2013-03-25 DIAGNOSIS — E785 Hyperlipidemia, unspecified: Secondary | ICD-10-CM

## 2013-03-25 DIAGNOSIS — Z0000 Encounter for general adult medical examination without abnormal findings: Secondary | ICD-10-CM

## 2013-03-25 NOTE — Patient Instructions (Addendum)
The patient is instructed to continue all medications as prescribed. Schedule followup with check out clerk upon leaving the clinic   Consider increase back to prednisone 5 mg a day

## 2013-03-25 NOTE — Progress Notes (Signed)
Subjective:    Patient ID: Jesus Wallace, male    DOB: [birth year], 67 y.o.   MRN: [id number]  HPI  CPX/ medicare exam Follow up for lipids on fish oil On Enbrel for psoriatic arthritis [facility]  Review of Systems  Constitutional: Negative for fever and fatigue.  HENT: Negative for hearing loss, congestion, neck pain and postnasal drip.   Eyes: Negative for discharge, redness and visual disturbance.  Respiratory: Negative for cough, shortness of breath and wheezing.   Cardiovascular: Negative for leg swelling.  Gastrointestinal: Negative for abdominal pain, constipation and abdominal distention.  Genitourinary: Negative for urgency and frequency.  Musculoskeletal: Negative for joint swelling and arthralgias.  Skin: Negative for color change and rash.  Neurological: Negative for weakness and light-headedness.  Hematological: Negative for adenopathy.  Psychiatric/Behavioral: Negative for behavioral problems.       Objective:   Physical Exam  Nursing note and vitals reviewed. Constitutional: He appears well-developed and well-nourished.  HENT:  Head: Normocephalic and atraumatic.  Eyes: Conjunctivae are normal. Pupils are equal, round, and reactive to light.  Neck: Normal range of motion. Neck supple.  Cardiovascular: Normal rate and regular rhythm.   Pulmonary/Chest: Effort normal and breath sounds normal.  Abdominal: Soft. Bowel sounds are normal.  Genitourinary:  [facility]state check for [facility] and started on flomax   Musculoskeletal: He exhibits edema and tenderness.  Neurological: He is alert.  Skin: Skin is warm and dry.          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 male who presents for Medicare  Initial preventive examination.   Preventive Screening-Counseling & Management  Tobacco History  Smoking status  . Former Smoker -- 1.00 packs/day for 30 years  . Types: Cigarettes  . Quit date: 09/16/1986  Smokeless tobacco  . Never Used    Problems Prior to Visit 1.   Current Problems (verified) Patient Active Problem List  Diagnosis  . HYPERLIPIDEMIA  . MIGRAINE, CHRONIC  . PERIPHERAL NEUROPATHY, FEET  . UMBILICAL HERNIA  . 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  . PROSTATITIS, ACUTE  . PSORIASIS  . OSTEOARTHRITIS  . Degeneration of Cervical Intervertebral Disc  . LOW BACK PAIN  . ROTATOR CUFF SYNDROME, RIGHT  . TRIGGER FINGER, LEFT MIDDLE  . CHEST PAIN, ATYPICAL  . UNS ADVRS EFF UNS RX MEDICINAL&BIOLOGICAL SBSTNC  . Cervical fusion syndrome  . Trigger finger, acquired    Medications Prior to Visit Current Outpatient Prescriptions on File Prior to Visit  Medication Sig Dispense Refill  . aspirin 81 MG tablet Take 81 mg by mouth daily.        . Calcium Carbonate-Vitamin D (CALCIUM PLUS VITAMIN D PO) Take 1 tablet by mouth daily.        Marland Kitchen etanercept (ENBREL) 50 MG/ML injection Inject 50 mg into the skin once a week. sunday      . FLUoxetine (PROZAC) 20 MG tablet Take 20 mg by mouth daily.        . folic acid (FOLVITE) 1 MG tablet Take 1 mg by mouth daily.        Marland Kitchen gabapentin (NEURONTIN) 300 MG capsule Take 300 mg by mouth 3 (three) times daily.        Marland Kitchen  Ginger, Zingiber officinalis, (GINGER ROOT) 550 MG CAPS Take 1 capsule by mouth daily.        Chilton Si Tea, Camillia sinensis, 315 MG CAPS Take 1 capsule by mouth daily.        Marland Kitchen ibuprofen (ADVIL,MOTRIN) 200 MG tablet Take 400 mg by mouth every 8 (eight) hours as needed. For pain.      . Lutein 40 MG CAPS Take 1 capsule by mouth every morning.        . Melatonin 5 MG CAPS Take 1 capsule by mouth at bedtime.        . mometasone (NASONEX) 50 MCG/ACT nasal spray Place 2 sprays into the nose daily as needed. For  stuffy nose.      . morphine (MSIR) 15 MG tablet Take 15 mg by mouth 2 (two) times daily as needed. For pain.      . Multiple Vitamin (MULITIVITAMIN WITH MINERALS) TABS Take 1 tablet by mouth daily.        . Omega-3 Fatty Acids (FISH OIL) 1200 MG CAPS Take 1 capsule by mouth every evening.        Marland Kitchen OVER THE COUNTER MEDICATION Take 1 tablet by mouth daily. Zyatonix 400 mg       . oxyCODONE (ROXICODONE) 15 MG immediate release tablet Take 1 tablet (15 mg total) by mouth every 6 (six) hours as needed. For pain.  90 tablet  0  . predniSONE (DELTASONE) 5 MG tablet Take 5 mg by mouth daily.        . Tamsulosin HCl (FLOMAX) 0.4 MG CAPS Take by mouth daily.      . Turmeric 500 MG CAPS Take 1 capsule by mouth daily.        . vitamin B-12 (CYANOCOBALAMIN) 1000 MCG tablet Take 1,000 mcg by mouth daily.        . vitamin C (ASCORBIC ACID) 500 MG tablet Take 500 mg by mouth daily.        Marland Kitchen zolpidem (AMBIEN CR) 12.5 MG CR tablet Take 6.25-9.375 mg by mouth at bedtime as needed. For sleep.       Current Facility-Administered Medications on File Prior to Visit  Medication Dose Route Frequency Provider Last Rate Last Dose  . methylPREDNISolone acetate (DEPO-MEDROL) injection 20 mg  20 mg Intra-articular Once Stacie Glaze, MD        Current Medications (verified) Current Outpatient Prescriptions  Medication Sig Dispense Refill  . aspirin 81 MG tablet Take 81 mg by mouth daily.        . Calcium Carbonate-Vitamin D (CALCIUM PLUS VITAMIN D PO) Take 1 tablet by mouth daily.        Marland Kitchen etanercept (ENBREL) 50 MG/ML injection Inject 50 mg into the skin once a week. sunday      . FLUoxetine (PROZAC) 20 MG tablet Take 20 mg by mouth daily.        . folic acid (FOLVITE) 1 MG tablet Take 1 mg by mouth daily.        Marland Kitchen gabapentin (NEURONTIN) 300 MG capsule Take 300 mg by mouth 3 (three) times daily.        . Ginger, Zingiber officinalis, (GINGER ROOT) 550 MG CAPS Take 1 capsule by mouth daily.        Chilton Si Tea,  Camillia sinensis, 315 MG CAPS Take 1 capsule by mouth daily.        Marland Kitchen ibuprofen (ADVIL,MOTRIN) 200 MG tablet Take 400 mg by mouth every 8 (eight) hours  as needed. For pain.      . Lutein 40 MG CAPS Take 1 capsule by mouth every morning.        . Melatonin 5 MG CAPS Take 1 capsule by mouth at bedtime.        . mometasone (NASONEX) 50 MCG/ACT nasal spray Place 2 sprays into the nose daily as needed. For stuffy nose.      . morphine (MSIR) 15 MG tablet Take 15 mg by mouth 2 (two) times daily as needed. For pain.      . Multiple Vitamin (MULITIVITAMIN WITH MINERALS) TABS Take 1 tablet by mouth daily.        . Omega-3 Fatty Acids (FISH OIL) 1200 MG CAPS Take 1 capsule by mouth every evening.        Marland Kitchen OVER THE COUNTER MEDICATION Take 1 tablet by mouth daily. Zyatonix 400 mg       . oxyCODONE (ROXICODONE) 15 MG immediate release tablet Take 1 tablet (15 mg total) by mouth every 6 (six) hours as needed. For pain.  90 tablet  0  . predniSONE (DELTASONE) 5 MG tablet Take 5 mg by mouth daily.        . Tamsulosin HCl (FLOMAX) 0.4 MG CAPS Take by mouth daily.      . Turmeric 500 MG CAPS Take 1 capsule by mouth daily.        . vitamin B-12 (CYANOCOBALAMIN) 1000 MCG tablet Take 1,000 mcg by mouth daily.        . vitamin C (ASCORBIC ACID) 500 MG tablet Take 500 mg by mouth daily.        Marland Kitchen zolpidem (AMBIEN CR) 12.5 MG CR tablet Take 6.25-9.375 mg by mouth at bedtime as needed. For sleep.       Current Facility-Administered Medications  Medication Dose Route Frequency Provider Last Rate Last Dose  . methylPREDNISolone acetate (DEPO-MEDROL) injection 20 mg  20 mg Intra-articular Once Stacie Glaze, MD         Allergies (verified) Other   PAST HISTORY  Family History Family History  Problem Relation Age of Onset  . Prostate cancer    . Heart disease Father   . Diabetes Father   . Cancer Father     prostate    Social History History  Substance Use Topics  . Smoking status: Former Smoker -- 1.00  packs/day for 30 years    Types: Cigarettes    Quit date: 09/16/1986  . Smokeless tobacco: Never Used  . Alcohol Use: No    Are there smokers in your home (other than you)?  No  Risk Factors Current exercise habits: walking but limited due to orthopedic pain  Dietary issues discussed: none   Cardiac risk factors: hypertension and sedentary lifestyle.  Depression Screen (Note: if answer to either of the following is "Yes", a more complete depression screening is indicated)   Q1: Over the past two weeks, have you felt down, depressed or hopeless? No  Q2: Over the past two weeks, have you felt little interest or pleasure in doing things? No  Have you lost interest or pleasure in daily life? No  Do you often feel hopeless? No  Do you cry easily over simple problems? No  Activities of Daily Living In your present state of health, do you have any difficulty performing the following activities?:  Driving? No Managing money?  No Feeding yourself? No Getting from bed to chair? No Climbing a flight of stairs? No Preparing food  and eating?: No Bathing or showering? No Getting dressed: No Getting to the toilet? No Using the toilet:No Moving around from place to place: No In the past year have you fallen or had a near fall?:No   Are you sexually active?  Yes  Do you have more than one partner?  No  Hearing Difficulties: No Do you often ask people to speak up or repeat themselves? No Do you experience ringing or noises in your ears? No Do you have difficulty understanding soft or whispered voices? No   Do you feel that you have a problem with memory? No  Do you often misplace items? No  Do you feel safe at home?  No  Cognitive Testing  Alert? Yes  Normal Appearance?Yes  Oriented to person? Yes  Place? Yes   Time? Yes  Recall of three objects?  Yes  Can perform simple calculations? Yes  Displays appropriate judgment?Yes  Can read the correct time from a watch  face?Yes   Advanced Directives have been discussed with the patient? Yes   List the Names of Other Physician/Practitioners you currently use: 1. Dr Rolene Course at [facility] for psoriasis   Indicate any recent Medical Services you may have received from other than [facility] providers in the past year (date may be approximate).  Immunization History  Administered Date(s) Administered  . Influenza Split 09/26/2012  . Influenza Whole 09/23/2010  . Tdap 03/25/2013  . Zoster 08/24/2012    Screening Tests Health Maintenance  Topic Date Due  . Tetanus/tdap  06/28/1967  . Colonoscopy  06/27/1998  . Zostavax  06/27/2008  . Influenza Vaccine  08/24/2013    All answers were reviewed with the patient and necessary referrals were made:  Carrie Mew, MD   03/25/2013   History reviewed: allergies, current medications, past family history, past medical history, past social history, past surgical history and problem list  Review of Systems Pertinent items are noted in HPI.    Objective:     Vision by Snellen chart: right eye:20/20, left eye:20/20 Blood pressure 116/70, pulse 50, temperature 97.9 F (36.6 C), resp. rate 16, height 5\' 9"  (1.753 m), weight 194 lb (87.998 kg). Body mass index is 28.64 kg/(m^2).  Exam per problem management     Assessment:      Patient presents for yearly preventative medicine examination.   all immunizations and health maintenance protocols were reviewed with the patient and they are up to date with these protocols.   screening laboratory values were reviewed with the patient including screening of hyperlipidemia PSA renal function and hepatic function.   There medications past medical history social history problem list and allergies were reviewed in detail.   Goals were established with regard to weight loss exercise diet in compliance with medications       Plan:     During the course of the visit the patient was educated and counseled about  appropriate screening and preventive services including:    Pneumococcal vaccine   Influenza vaccine  Td vaccine  Colorectal cancer screening  Diet review for nutrition referral? Yes ____  Not Indicated ____   Patient Instructions (the written plan) was given to the patient.  Medicare Attestation I have personally reviewed: The patient's medical and social history Their use of alcohol, tobacco or illicit drugs Their current medications and supplements The patient's functional ability including ADLs,fall risks, home safety risks, cognitive, and hearing and visual impairment Diet and physical activities Evidence for depression or mood disorders  The  patient's weight, height, BMI, and visual acuity have been recorded in the chart.  I have made referrals, counseling, and provided education to the patient based on review of the above and I have provided the patient with a written personalized care plan for preventive services.     Carrie Mew, MD   03/25/2013

## 2013-06-03 ENCOUNTER — Telehealth: Payer: Self-pay | Admitting: Internal Medicine

## 2013-06-03 NOTE — Telephone Encounter (Signed)
Pt called and stated that he would like Dr. Lovell Sheehan to write him a letter relieving him of having to wear the shoulder part of his seat belt. He states that Dr. Lovell Sheehan has done this in the past, and he would like him to do it again. Please assist.

## 2013-06-05 ENCOUNTER — Encounter: Payer: Self-pay | Admitting: Internal Medicine

## 2013-06-09 ENCOUNTER — Encounter: Payer: Self-pay | Admitting: Family Medicine

## 2013-06-09 ENCOUNTER — Ambulatory Visit: Payer: Medicare Other | Admitting: Family Medicine

## 2013-06-09 ENCOUNTER — Ambulatory Visit (INDEPENDENT_AMBULATORY_CARE_PROVIDER_SITE_OTHER): Payer: Medicare Other | Admitting: Family Medicine

## 2013-06-09 VITALS — BP 110/80 | Temp 98.9°F

## 2013-06-09 DIAGNOSIS — J039 Acute tonsillitis, unspecified: Secondary | ICD-10-CM

## 2013-06-09 MED ORDER — AMOXICILLIN 875 MG PO TABS: 875.0000 mg | ORAL_TABLET | Freq: Two times a day (BID) | ORAL | Status: DC

## 2013-06-09 NOTE — Patient Instructions (Signed)
-  As we discussed, we have prescribed a new medication for you at this appointment. We discussed the common and serious potential adverse effects of this medication and you can review these and more with the pharmacist when you pick up your medication.  Please follow the instructions for use carefully and notify us immediately if you have any problems taking this medication.  -follow up with your doctor as needed

## 2013-06-09 NOTE — Progress Notes (Signed)
Chief Complaint  Patient presents with  . Sore Throat    chills, body aches, dizziness, cough    HPI:  Acute visit for sore throat: -started 3 days ago after going to [place] for funeral -had a fever he thinks for the first day or two - chills and sweats, better now but continues to have sore throat, nasal congestion, drainage in throat, cough - yellow mucus, body aches, dizzy at times -denies exposure to mono or strep -has tried: throat lozenges, honey garlic  ROS: See pertinent positives and negatives per HPI.  Past Medical History  Diagnosis Date  . Hyperlipidemia   . Psoriasis   . Low back pain   . Raynaud disease   . Generalized headaches     with occasional migraines  . Hearing loss   . Leg swelling   . Trouble swallowing   . Umbilical hernia   . PONV (postoperative nausea and vomiting)   . Heart murmur     asymptomatic  . Arthritis     osteoarthritis  . Neuralgia     takes gabapentin for numbness  . 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 Prostatitis, unspecified 2011  . Depression     Family History  Problem Relation Age of Onset  . Prostate cancer    . Heart disease Father   . Diabetes Father   . Cancer Father     prostate    History   Social History  . Marital Status: Married    Spouse Name: N/A    Number of Children: N/A  . Years of Education: N/A   Social History Main Topics  . Smoking status: Former Smoker -- 1.00 packs/day for 30 years    Types: Cigarettes    Quit date: 09/16/1986  . Smokeless tobacco: Never Used  . Alcohol Use: No  . Drug Use: No  . Sexually Active: Yes   Other Topics Concern  . None   Social History Narrative  . None    Current outpatient prescriptions:aspirin 81 MG tablet, Take 81 mg by mouth daily.  , Disp: , Rfl: ;  Calcium Carbonate-Vitamin D (CALCIUM PLUS VITAMIN D PO), Take 1 tablet by mouth daily.  , Disp: , Rfl: ;  etanercept (ENBREL) 50 MG/ML injection, Inject 50 mg into the skin once a week.  sunday, Disp: , Rfl: ;  FLUoxetine (PROZAC) 20 MG tablet, Take 20 mg by mouth daily.  , Disp: , Rfl:  folic acid (FOLVITE) 1 MG tablet, Take 1 mg by mouth daily.  , Disp: , Rfl: ;  gabapentin (NEURONTIN) 300 MG capsule, Take 300 mg by mouth 3 (three) times daily.  , Disp: , Rfl: ;  Ginger, Zingiber officinalis, (GINGER ROOT) 550 MG CAPS, Take 1 capsule by mouth daily.  , Disp: , Rfl: ;  Green Tea, Camillia sinensis, 315 MG CAPS, Take 1 capsule by mouth daily.  , Disp: , Rfl:  ibuprofen (ADVIL,MOTRIN) 200 MG tablet, Take 400 mg by mouth every 8 (eight) hours as needed. For pain., Disp: , Rfl: ;  Lutein 40 MG CAPS, Take 1 capsule by mouth every morning.  , Disp: , Rfl: ;  Melatonin 5 MG CAPS, Take 1 capsule by mouth at bedtime.  , Disp: , Rfl: ;  mometasone (NASONEX) 50 MCG/ACT nasal spray, Place 2 sprays into the nose daily as needed. For stuffy nose., Disp: , Rfl:  morphine (MSIR) 15 MG tablet, Take 15 mg by mouth 2 (two)  times daily as needed. For pain., Disp: , Rfl: ;  Multiple Vitamin (MULITIVITAMIN WITH MINERALS) TABS, Take 1 tablet by mouth daily.  , Disp: , Rfl: ;  Omega-3 Fatty Acids (FISH OIL) 1200 MG CAPS, Take 1 capsule by mouth every evening.  , Disp: , Rfl: ;  OVER THE COUNTER MEDICATION, Take 1 tablet by mouth daily. Zyatonix 400 mg , Disp: , Rfl:  oxyCODONE (ROXICODONE) 15 MG immediate release tablet, Take 1 tablet (15 mg total) by mouth every 6 (six) hours as needed. For pain., Disp: 90 tablet, Rfl: 0;  predniSONE (DELTASONE) 5 MG tablet, Take 5 mg by mouth daily.  , Disp: , Rfl: ;  Tamsulosin HCl (FLOMAX) 0.4 MG CAPS, Take by mouth daily., Disp: , Rfl: ;  Turmeric 500 MG CAPS, Take 1 capsule by mouth daily.  , Disp: , Rfl:  vitamin B-12 (CYANOCOBALAMIN) 1000 MCG tablet, Take 1,000 mcg by mouth daily.  , Disp: , Rfl: ;  vitamin C (ASCORBIC ACID) 500 MG tablet, Take 500 mg by mouth daily.  , Disp: , Rfl: ;  zolpidem (AMBIEN CR) 12.5 MG CR tablet, Take 6.25-9.375 mg by mouth at bedtime as needed.  For sleep., Disp: , Rfl: ;  amoxicillin (AMOXIL) 875 MG tablet, Take 1 tablet (875 mg total) by mouth 2 (two) times daily., Disp: 20 tablet, Rfl: 0 Current facility-administered medications:methylPREDNISolone acetate (DEPO-MEDROL) injection 20 mg, 20 mg, Intra-articular, Once, Stacie Glaze, MD  [facility] Vitals:   06/09/13 1256  BP: 110/80  Temp: 98.9 F (37.2 C)    Body mass index is 0.00 kg/(m^2).  GENERAL: vitals reviewed and listed above, alert, oriented, appears well hydrated and in no acute distress  HEENT: atraumatic, conjunttiva clear, no obvious abnormalities on inspection of external nose and ears, normal appearance of ear canals and TMs, clear nasal congestion, mild post oropharyngeal erythema with PND, no tonsillar edema or exudate, no sinus TTP  NECK: no obvious masses on inspection  LUNGS: clear to auscultation bilaterally, no wheezes, rales or rhonchi, good air movement  CV: HRRR, no peripheral edema  MS: moves all extremities without noticeable abnormality  PSYCH: pleasant and cooperative, no obvious depression or anxiety  ASSESSMENT AND PLAN:  Discussed the following assessment and plan:  Acute tonsillitis - Plan: amoxicillin (AMOXIL) 875 MG tablet  -benign exm, afebrile, suspect viral -pt reports immunocompromised due to psoriasis and on prednisone and usually get abx when sick -given hx and ? Fevers, ? immunocompromised state will start amoxicillin, risks and return precautions discussed -Patient advised to return or notify a doctor immediately if symptoms worsen or persist or new concerns arise.  There are no Patient Instructions on file for this visit.   Jesus Basque R.

## 2013-06-19 NOTE — Telephone Encounter (Signed)
PT calling to inquire about this letter, and stated that he will need to take this letter to the judge to the week after next. Please assist.

## 2013-06-24 NOTE — Telephone Encounter (Signed)
PT calling to inquire about the status of this letter. Please assist.

## 2013-06-25 ENCOUNTER — Telehealth: Payer: Self-pay | Admitting: Internal Medicine

## 2013-06-25 NOTE — Telephone Encounter (Signed)
duplicate

## 2013-06-25 NOTE — Telephone Encounter (Signed)
PT is calling and requesting a copy of the previous letter that was issued to him, from 2010. Please advise.

## 2013-06-29 ENCOUNTER — Telehealth: Payer: Self-pay | Admitting: *Deleted

## 2013-06-29 NOTE — Telephone Encounter (Signed)
Pt calls again today asking for letter from dr Lovell Sheehan wanting permission not to wear sear belt due to should pain--we made copy of note from 2010 that dr Lovell Sheehan wrote stating he could not wear a seat belt at that time because of his shoulder pain and taking physical therapy. Pt informed that he has had rotator cuff surgery since then and he would need to be evaluated by dr bean for that.  Pt now ask for a copy of the letter from 2010.  A copy of the letter was given to the pt .  No signature

## 2013-08-24 DIAGNOSIS — S99911A Unspecified injury of right ankle, initial encounter: Secondary | ICD-10-CM

## 2013-08-24 DIAGNOSIS — M720 Palmar fascial fibromatosis [Dupuytren]: Secondary | ICD-10-CM

## 2013-08-24 HISTORY — DX: Unspecified injury of right ankle, initial encounter: S99.911A

## 2013-08-24 HISTORY — DX: Palmar fascial fibromatosis (dupuytren): M72.0

## 2013-08-25 ENCOUNTER — Other Ambulatory Visit: Payer: Self-pay | Admitting: Orthopedic Surgery

## 2013-08-28 ENCOUNTER — Encounter (HOSPITAL_BASED_OUTPATIENT_CLINIC_OR_DEPARTMENT_OTHER): Payer: Self-pay | Admitting: *Deleted

## 2013-08-28 NOTE — Progress Notes (Signed)
08/28/13 1450  OBSTRUCTIVE SLEEP APNEA  Have you ever been diagnosed with sleep apnea through a sleep study? No  Do you snore loudly (loud enough to be heard through closed doors)?  1  Do you often feel tired, fatigued, or sleepy during the daytime? 1  Has anyone observed you stop breathing during your sleep? 0  Do you have, or are you being treated for high blood pressure? 0  BMI more than 35 kg/m2? 0  Age over 65 years old? 1  Gender: 1  Obstructive Sleep Apnea Score 4  Score 4 or greater  Results sent to PCP (Dr. Darryll Capers)

## 2013-09-02 NOTE — H&P (Signed)
Jesus Wallace is an 67 y.o. male.   Chief Complaint: c/o chronic and progressive STS symptoms bilateral long fingers HPI:  He is a primary care and rheumatology patient of the [place] system.  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  He has history of rheumatoid arthritis and psoriatic arthritis.  He is on Enbrel with weekly injections. He is followed in the Rheumatology Clinic at the [facility].    Past Medical History  Diagnosis Date  . Psoriasis     generalized  . PONV (postoperative nausea and vomiting)   . Arthritis     osteoarthritis  . Depression   . Headache(784.0)     several weekly - since having cervical fusion  . Chronic lower back pain   . Impaired hearing   . Raynaud disease     right hand  . Dupuytren's contracture of both hands 08/2013    bilateral long fingers  . Right ankle injury 08/2013    states tendon is pulled away from bone  . BPH (benign prostatic hypertrophy)     states prostate is spongy  . Heart murmur     states is asymptomatic  . Limited joint range of motion     in neck, since cervical fusion    Past Surgical History  Procedure Laterality Date  . Rotator cuff repair Right 06/13/2007; 02/16/2008  . Umbilical hernia repair  01/04/2012    Procedure: HERNIA REPAIR UMBILICAL ADULT;  Surgeon: Shelly Rubenstein, MD;  Location: [facility] OR;  Service: General;  Laterality: N/A;  umbilical hernia repair   . Shoulder arthroscopy with rotator cuff repair Right 06/01/2010  . Anterior cervical decomp/discectomy fusion  12/30/2008    C5-6, C6-7  . Mass excision Left 03/10/2004    posterior chest    Family History  Problem Relation Age of Onset  . Prostate cancer    . Heart disease Father   . Diabetes Father   . Cancer Father     prostate   Social History:  reports that he quit smoking about 26 years ago. He has never used smokeless tobacco. He reports that  drinks alcohol. He reports that he does not  use illicit drugs.  Allergies: No Known Allergies  No prescriptions prior to admission    No results found for this or any previous visit (from the past 48 hour(s)).  No results found.   Pertinent items are noted in HPI.  Height 5\' 9"  (1.753 m), weight 86.183 kg (190 lb).  General appearance: alert Head: Normocephalic, without obvious abnormality Neck: supple, symmetrical, trachea midline Resp: clear to auscultation bilaterally Cardio: regular rate and rhythm GI: normal findings: bowel sounds normal Extremities:  Inspection of his hands reveals early Dupuytren's palmar fibromatosis in the pretendinous fibers of the long fingers bilaterally. He has intact pulse and capillary refill.  He has active stenosing tenosynovitis of his long fingers bilaterally with locking in flexion.  He has minimal flexion contractures of the long fingers bilaterally.   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  Jesus Wallace,Anni Hocevar J 09/02/2013, 3:51 PM   H&P documentation: 09/03/2013  -History and Physical Reviewed  -Patient has been  re-examined  -No change in the plan of care  Wyn Forster, MD

## 2013-09-03 ENCOUNTER — Encounter (HOSPITAL_BASED_OUTPATIENT_CLINIC_OR_DEPARTMENT_OTHER)
Admission: RE | Disposition: A | Discharge status: Home / Self Care | Payer: Self-pay | Source: Ambulatory Visit | Attending: Orthopedic Surgery

## 2013-09-03 ENCOUNTER — Ambulatory Visit (HOSPITAL_BASED_OUTPATIENT_CLINIC_OR_DEPARTMENT_OTHER): Payer: Non-veteran care | Admitting: Anesthesiology

## 2013-09-03 ENCOUNTER — Encounter (HOSPITAL_BASED_OUTPATIENT_CLINIC_OR_DEPARTMENT_OTHER): Payer: Self-pay | Admitting: Orthopedic Surgery

## 2013-09-03 ENCOUNTER — Encounter (HOSPITAL_BASED_OUTPATIENT_CLINIC_OR_DEPARTMENT_OTHER): Payer: Self-pay | Admitting: Anesthesiology

## 2013-09-03 ENCOUNTER — Ambulatory Visit (HOSPITAL_BASED_OUTPATIENT_CLINIC_OR_DEPARTMENT_OTHER)
Admission: RE | Admit: 2013-09-03 | Discharge: 2013-09-03 | Disposition: A | Discharge status: Home / Self Care | Payer: Non-veteran care | Source: Ambulatory Visit | Attending: Orthopedic Surgery | Admitting: Orthopedic Surgery

## 2013-09-03 DIAGNOSIS — M069 Rheumatoid arthritis, unspecified: Secondary | ICD-10-CM | POA: Insufficient documentation

## 2013-09-03 DIAGNOSIS — M720 Palmar fascial fibromatosis [Dupuytren]: Secondary | ICD-10-CM | POA: Insufficient documentation

## 2013-09-03 HISTORY — DX: Palmar fascial fibromatosis (dupuytren): M72.0

## 2013-09-03 HISTORY — DX: Benign prostatic hyperplasia without lower urinary tract symptoms: N40.0

## 2013-09-03 HISTORY — DX: Reserved for inherently not codable concepts without codable children: IMO0001

## 2013-09-03 HISTORY — PX: TRIGGER FINGER RELEASE: SHX641

## 2013-09-03 HISTORY — DX: Headache: R51

## 2013-09-03 HISTORY — DX: Low back pain, unspecified: M54.50

## 2013-09-03 HISTORY — DX: Other chronic pain: G89.29

## 2013-09-03 HISTORY — DX: Unspecified injury of right ankle, initial encounter: S99.911A

## 2013-09-03 HISTORY — DX: Low back pain: M54.5

## 2013-09-03 HISTORY — DX: Unspecified hearing loss, unspecified ear: H91.90

## 2013-09-03 HISTORY — DX: Stiffness of unspecified joint, not elsewhere classified: M25.60

## 2013-09-03 LAB — POCT HEMOGLOBIN-HEMACUE: Hemoglobin: 14.3 g/dL (ref 13.0–17.0)

## 2013-09-03 SURGERY — RELEASE, A1 PULLEY, FOR TRIGGER FINGER
Anesthesia: Monitor Anesthesia Care | Site: Hand | Laterality: Bilateral | Wound class: Clean

## 2013-09-03 MED ORDER — LIDOCAINE HCL (CARDIAC) 20 MG/ML IV SOLN
INTRAVENOUS | Status: DC | PRN
  Administered 2013-09-03: 50 mg via INTRAVENOUS

## 2013-09-03 MED ORDER — ONDANSETRON HCL 4 MG/2ML IJ SOLN: 4.0000 mg | Freq: Four times a day (QID) | INTRAMUSCULAR | Status: DC | PRN

## 2013-09-03 MED ORDER — CHLORHEXIDINE GLUCONATE 4 % EX LIQD: 60.0000 mL | Freq: Once | CUTANEOUS | Status: DC

## 2013-09-03 MED ORDER — ONDANSETRON HCL 4 MG/2ML IJ SOLN
INTRAMUSCULAR | Status: DC | PRN
  Administered 2013-09-03: 4 mg via INTRAVENOUS

## 2013-09-03 MED ORDER — DEXAMETHASONE SODIUM PHOSPHATE 10 MG/ML IJ SOLN
INTRAMUSCULAR | Status: DC | PRN
  Administered 2013-09-03: 10 mg via INTRAVENOUS

## 2013-09-03 MED ORDER — OXYCODONE HCL 5 MG/5ML PO SOLN: 5.0000 mg | Freq: Once | ORAL | Status: DC | PRN

## 2013-09-03 MED ORDER — LACTATED RINGERS IV SOLN
INTRAVENOUS | Status: DC
  Administered 2013-09-03: 09:00:00 via INTRAVENOUS

## 2013-09-03 MED ORDER — FENTANYL CITRATE 0.05 MG/ML IJ SOLN
INTRAMUSCULAR | Status: DC | PRN
Start: 2013-09-03 — End: 2013-09-03
  Administered 2013-09-03: 100 ug via INTRAVENOUS

## 2013-09-03 MED ORDER — LIDOCAINE HCL 2 % IJ SOLN
INTRAMUSCULAR | Status: DC | PRN
  Administered 2013-09-03: 6 mL

## 2013-09-03 MED ORDER — OXYCODONE-ACETAMINOPHEN 5-325 MG PO TABS: ORAL_TABLET | ORAL | Status: DC

## 2013-09-03 MED ORDER — MIDAZOLAM HCL 5 MG/5ML IJ SOLN
INTRAMUSCULAR | Status: DC | PRN
  Administered 2013-09-03: 1 mg via INTRAVENOUS

## 2013-09-03 MED ORDER — MIDAZOLAM HCL 2 MG/ML PO SYRP: 12.0000 mg | ORAL_SOLUTION | Freq: Once | ORAL | Status: DC | PRN

## 2013-09-03 MED ORDER — FENTANYL CITRATE 0.05 MG/ML IJ SOLN: 25.0000 ug | INTRAMUSCULAR | Status: DC | PRN

## 2013-09-03 MED ORDER — PROPOFOL INFUSION 10 MG/ML OPTIME
INTRAVENOUS | Status: DC | PRN
  Administered 2013-09-03: 200 ug/kg/min via INTRAVENOUS

## 2013-09-03 MED ORDER — FENTANYL CITRATE 0.05 MG/ML IJ SOLN: 50.0000 ug | INTRAMUSCULAR | Status: DC | PRN

## 2013-09-03 MED ORDER — MIDAZOLAM HCL 2 MG/2ML IJ SOLN: 1.0000 mg | INTRAMUSCULAR | Status: DC | PRN

## 2013-09-03 MED ORDER — OXYCODONE HCL 5 MG PO TABS: 5.0000 mg | ORAL_TABLET | Freq: Once | ORAL | Status: DC | PRN

## 2013-09-03 SURGICAL SUPPLY — 37 items
BANDAGE ADHESIVE 1X3 (GAUZE/BANDAGES/DRESSINGS) IMPLANT
BANDAGE COBAN STERILE 2 (GAUZE/BANDAGES/DRESSINGS) ×4 IMPLANT
BLADE SURG 15 STRL LF DISP TIS (BLADE) ×1 IMPLANT
BLADE SURG 15 STRL SS (BLADE) ×2
BNDG CMPR 9X4 STRL LF SNTH (GAUZE/BANDAGES/DRESSINGS) ×1
BNDG ESMARK 4X9 LF (GAUZE/BANDAGES/DRESSINGS) ×2 IMPLANT
BRUSH SCRUB EZ PLAIN DRY (MISCELLANEOUS) ×4 IMPLANT
CLOTH BEACON ORANGE TIMEOUT ST (SAFETY) ×2 IMPLANT
CORDS BIPOLAR (ELECTRODE) IMPLANT
COVER MAYO STAND STRL (DRAPES) ×4 IMPLANT
COVER TABLE BACK 60X90 (DRAPES) ×2 IMPLANT
CUFF TOURNIQUET SINGLE 18IN (TOURNIQUET CUFF) IMPLANT
DECANTER SPIKE VIAL GLASS SM (MISCELLANEOUS) IMPLANT
DRAPE EXTREMITY T 121X128X90 (DRAPE) ×4 IMPLANT
DRAPE SURG 17X23 STRL (DRAPES) ×4 IMPLANT
DRSG TEGADERM 2-3/8X2-3/4 SM (GAUZE/BANDAGES/DRESSINGS) ×4 IMPLANT
GLOVE BIOGEL M STRL SZ7.5 (GLOVE) ×2 IMPLANT
GLOVE EXAM NITRILE EXT CUFF MD (GLOVE) ×4 IMPLANT
GLOVE ORTHO TXT STRL SZ7.5 (GLOVE) ×2 IMPLANT
GOWN BRE IMP PREV XXLGXLNG (GOWN DISPOSABLE) ×2 IMPLANT
GOWN PREVENTION PLUS XLARGE (GOWN DISPOSABLE) ×2 IMPLANT
NEEDLE 27GAX1X1/2 (NEEDLE) ×2 IMPLANT
PACK BASIN DAY SURGERY FS (CUSTOM PROCEDURE TRAY) ×2 IMPLANT
PADDING CAST ABS 4INX4YD NS (CAST SUPPLIES) ×1
PADDING CAST ABS COTTON 4X4 ST (CAST SUPPLIES) ×1 IMPLANT
SPONGE GAUZE 4X4 12PLY (GAUZE/BANDAGES/DRESSINGS) ×2 IMPLANT
STOCKINETTE 4X48 STRL (DRAPES) ×4 IMPLANT
STRIP CLOSURE SKIN 1/2X4 (GAUZE/BANDAGES/DRESSINGS) IMPLANT
SUT ETHILON 5 0 P 3 18 (SUTURE)
SUT NYLON ETHILON 5-0 P-3 1X18 (SUTURE) IMPLANT
SUT PROLENE 3 0 PS 2 (SUTURE) IMPLANT
SUT PROLENE 4 0 P 3 18 (SUTURE) ×2 IMPLANT
SYR 3ML 23GX1 SAFETY (SYRINGE) IMPLANT
SYR CONTROL 10ML LL (SYRINGE) ×2 IMPLANT
TOWEL OR 17X24 6PK STRL BLUE (TOWEL DISPOSABLE) ×2 IMPLANT
TRAY DSU PREP LF (CUSTOM PROCEDURE TRAY) ×2 IMPLANT
UNDERPAD 30X30 INCONTINENT (UNDERPADS AND DIAPERS) ×4 IMPLANT

## 2013-09-03 NOTE — Op Note (Signed)
[id number] 

## 2013-09-03 NOTE — Op Note (Signed)
NAMEDAVIEN, MALONE                  ACCOUNT NO.:  [id number]  MEDICAL RECORD NO.:  [id number]  LOCATION:                               FACILITY:  [facility]  PHYSICIAN:  Katy Fitch. Brek Reece, M.D. DATE OF BIRTH:  Jul 21, 1946  DATE OF PROCEDURE:  09/03/2013 DATE OF DISCHARGE:  09/03/2013                              OPERATIVE REPORT   PREOPERATIVE DIAGNOSES:  Chronic stenosing tenosynovitis, right long and left long fingers at A1 pulley with background Dupuytren's palmar fibromatosis and significant contracture of pretendinous fibers, right long and left long fingers.  POSTOPERATIVE DIAGNOSIS:  Chronic stenosing tenosynovitis, right long and left long fingers at A1 pulley with background Dupuytren's palmar fibromatosis and significant contracture of pretendinous fibers, right long and left long fingers.  OPERATION: 1. Resection of pretendinous fibers, palmar fascia, right and long     finger. 2. Release of right long finger A1 pulley. 3. Resection of palmar fascia, pretendinous fibers, left long finger. 4. Release of left long finger A1 pulley.  OPERATING SURGEON:  Katy Fitch. Nellene Courtois, MD.  ASSISTANT:  Surgical technician.  ANESTHESIA:  Anesthesia care with IV sedation and 2% lidocaine, palmar and flexor sheath block of right long and left long fingers.  SUPERVISED ANESTHESIOLOGIST:  Dr. Chaney Malling.  INDICATIONS:  Jesus Wallace is a 67 year old retired gentleman who presents for evaluation of bilateral locking long fingers.  He was referred by the [facility] medical system for private sector care.  He was noted to have significant Dupuytren's palmar fibromatosis and locking of his long fingers bilaterally.  We advised that if he had a palmar incision made, he would develop nodular fibromatosis due to his genetic predisposition to make palmar fibromatosis.  We recommended a segmental resection of his palmar fascia at the time of his long finger A1 pulley releases of the right and  left hands.  After informed consent, he was brought to the operating room at this time.  Preoperatively, we requested and received permission from the [place] [facility] to proceed with the surgeries as mentioned.  After informed consent, he was brought to the operating room at this time.  PROCEDURE:  Lou Cal by Dr. Seward Meth of Anesthesia and monitored anesthesia care was recommended and accepted.  He was then transferred to room #8 of the [facility], where his hands were noted to be premarked with marking pen, identifying the right long finger and palm and the left long finger and palms proper surgical sites per our identification protocol.  In room #8 under Dr. Seward Meth direct supervision, IV sedation was provided followed by routine Betadine scrub and paint of the right and left upper extremities.  The right hand and arm were exsanguinated with Esmarch bandage.  The Esmarch bandage was left in the proximal forearm as a tourniquet.  A 2% lidocaine was infiltrated in the path of the intended incision of the palm around the palmar fascia and the flexor sheath of the right long finger.  Procedure commenced with a routine surgical time-out.  A short oblique incision was fashioned directly over the A1 pulley at the distal palmar crease.  Subcutaneous tissues were carefully divided, and care was taken to resect  a 15 mm segment of the palmar fascia, including the pretendinous fibers and the septae.  The A1 pulley was isolated, split with scalpel scissors and a small A0 pulley identified and released. The flexor tendons delivered and found to be fibrotic tenosynovium which was removed with a micro rongeur.  Thereafter, full passive motion of the fingers were noted.  The wound was repaired with intradermal 4-0 Prolene suture.  A Steri- Strip was applied followed by application of a small gauze dressing with Tegaderm and a Coban overwrap.  The tourniquet was released  with immediate capillary refill of the fingers and thumb.  Attention was then directed to the left hand.  A 2% lidocaine was infiltrated in the path of the intended incision around the palmar fashion in the flexor sheath of a left long finger.  The hand and arm had been prepped with Betadine soap and solution, sterilely draped.  The hand and arm were exsanguinated with Esmarch bandage and Esmarch left on the forearm as a tourniquet.  An oblique incision was fashioned.  The distal palmar crease followed by segmental resection of the palmar fascia, and septae.  The A1 pulley was isolated, split with scalpel scissors.  Small A0 pulley was identified and released.  The flexor tendons delivered and found to have some fraying that was debrided with a micro rongeur.  Full passive motion of the left long fingers recovered.  The wound was then repaired with intradermal 4-0 Prolene followed by sterile gauze and a Tegaderm.  A Coban overwrap was applied.  The tourniquet was released with immediate capillary refill to the fingers and thumb.  No apparent complications.     Katy Fitch Shaka Cardin, M.D.   ______________________________ Katy Fitch. Avraham Benish, M.D.   RVS/MEDQ  D:  09/03/2013  T:  09/03/2013  Job:  [id number]

## 2013-09-03 NOTE — Anesthesia Postprocedure Evaluation (Signed)
Anesthesia Post Note  Patient: Jesus Wallace  Procedure(s) Performed: Procedure(s) (LRB): RELEASE TRIGGER FINGER/A-1 PULLEY RIGHT AND LEFT LONG FINGERS (Bilateral)  Anesthesia type: MAC  Patient location: PACU  Post pain: Pain level controlled and Adequate analgesia  Post assessment: Post-op Vital signs reviewed, Patient's Cardiovascular Status Stable and Respiratory Function Stable  Last Vitals:  Filed Vitals:   09/03/13 1015  BP: 119/65  Pulse: 51  Temp:   Resp: 12    Post vital signs: Reviewed and stable  Level of consciousness: awake, alert  and oriented  Complications: No apparent anesthesia complications

## 2013-09-03 NOTE — Brief Op Note (Signed)
09/03/2013  9:44 AM  PATIENT:  Jesus Wallace  67 y.o. male  PRE-OPERATIVE DIAGNOSIS:  DUPUYTRENS AND STS BILATERAL LONG FINGERS  POST-OPERATIVE DIAGNOSIS:  DUPUYTRENS AND STS BILATERAL LONG FINGERS  PROCEDURE:  Procedure(s): RELEASE TRIGGER FINGER/A-1 PULLEY RIGHT AND LEFT LONG FINGERS (Bilateral) excision of palmar fascia in palms bilaterally  SURGEON:  Surgeon(s) and Role:    * Wyn Forster., MD - Primary  PHYSICIAN ASSISTANT:   ASSISTANTS: surgical tech  ANESTHESIA:   MAC  EBL:  Total I/O In: 700 [I.V.:700] Out: -   BLOOD ADMINISTERED:none  DRAINS: none   LOCAL MEDICATIONS USED:  XYLOCAINE   SPECIMEN:  No Specimen  DISPOSITION OF SPECIMEN:  N/A  COUNTS:  YES  TOURNIQUET:    DICTATION: .Other Dictation: Dictation Number [id number]  PLAN OF CARE: Discharge to home after PACU  PATIENT DISPOSITION:  PACU - hemodynamically stable.   Delay start of Pharmacological VTE agent (>24hrs) due to surgical blood loss or risk of bleeding: not applicable

## 2013-09-03 NOTE — Anesthesia Procedure Notes (Signed)
Procedure Name: MAC Performed by: Stephfon Bovey W Pre-anesthesia Checklist: Patient identified, Timeout performed, Emergency Drugs available, Suction available and Patient being monitored Patient Re-evaluated:Patient Re-evaluated prior to inductionOxygen Delivery Method: Simple face mask Placement Confirmation: positive ETCO2 Dental Injury: Teeth and Oropharynx as per pre-operative assessment      

## 2013-09-03 NOTE — Anesthesia Preprocedure Evaluation (Signed)
Anesthesia Evaluation  Patient identified by MRN, date of birth, ID band Patient awake    Reviewed: Allergy & Precautions, H&P , NPO status , Patient's Chart, lab work & pertinent test results  History of Anesthesia Complications (+) PONV  Airway Mallampati: II  Neck ROM: limited    Dental   Pulmonary former smoker,          Cardiovascular + Peripheral Vascular Disease     Neuro/Psych  Headaches, Depression  Neuromuscular disease    GI/Hepatic   Endo/Other    Renal/GU      Musculoskeletal  (+) Arthritis -,   Abdominal   Peds  Hematology   Anesthesia Other Findings   Reproductive/Obstetrics                           Anesthesia Physical Anesthesia Plan  ASA: II  Anesthesia Plan: MAC   Post-op Pain Management:    Induction: Intravenous  Airway Management Planned: Simple Face Mask  Additional Equipment:   Intra-op Plan:   Post-operative Plan:   Informed Consent: I have reviewed the patients History and Physical, chart, labs and discussed the procedure including the risks, benefits and alternatives for the proposed anesthesia with the patient or authorized representative who has indicated his/her understanding and acceptance.     Plan Discussed with: CRNA, Anesthesiologist and Surgeon  Anesthesia Plan Comments:         Anesthesia Quick Evaluation

## 2013-09-03 NOTE — Transfer of Care (Signed)
Immediate Anesthesia Transfer of Care Note  Patient: Jesus Wallace  Procedure(s) Performed: Procedure(s): RELEASE TRIGGER FINGER/A-1 PULLEY RIGHT AND LEFT LONG FINGERS (Bilateral)  Patient Location: PACU  Anesthesia Type:MAC  Level of Consciousness: awake, alert  and oriented  Airway & Oxygen Therapy: Patient Spontanous Breathing and Patient connected to face mask oxygen  Post-op Assessment: Report given to PACU RN and Post -op Vital signs reviewed and stable  Post vital signs: Reviewed and stable  Complications: No apparent anesthesia complications

## 2013-09-04 ENCOUNTER — Encounter (HOSPITAL_BASED_OUTPATIENT_CLINIC_OR_DEPARTMENT_OTHER): Payer: Self-pay | Admitting: Orthopedic Surgery

## 2013-09-25 ENCOUNTER — Ambulatory Visit: Payer: Medicare Other | Admitting: Internal Medicine

## 2019-12-07 ENCOUNTER — Encounter (HOSPITAL_COMMUNITY): Payer: Self-pay | Admitting: Emergency Medicine

## 2019-12-07 ENCOUNTER — Other Ambulatory Visit: Payer: Self-pay

## 2019-12-07 ENCOUNTER — Emergency Department (HOSPITAL_COMMUNITY): Payer: No Typology Code available for payment source

## 2019-12-07 ENCOUNTER — Emergency Department (HOSPITAL_COMMUNITY)
Admission: EM | Admit: 2019-12-07 | Discharge: 2019-12-07 | Disposition: A | Discharge status: Home / Self Care | Payer: No Typology Code available for payment source | Attending: Emergency Medicine | Admitting: Emergency Medicine

## 2019-12-07 DIAGNOSIS — Y999 Unspecified external cause status: Secondary | ICD-10-CM | POA: Diagnosis not present

## 2019-12-07 DIAGNOSIS — X500XXA Overexertion from strenuous movement or load, initial encounter: Secondary | ICD-10-CM | POA: Diagnosis not present

## 2019-12-07 DIAGNOSIS — Z79899 Other long term (current) drug therapy: Secondary | ICD-10-CM | POA: Insufficient documentation

## 2019-12-07 DIAGNOSIS — Y93H2 Activity, gardening and landscaping: Secondary | ICD-10-CM | POA: Diagnosis not present

## 2019-12-07 DIAGNOSIS — Y929 Unspecified place or not applicable: Secondary | ICD-10-CM | POA: Diagnosis not present

## 2019-12-07 DIAGNOSIS — S80912A Unspecified superficial injury of left knee, initial encounter: Secondary | ICD-10-CM | POA: Diagnosis not present

## 2019-12-07 DIAGNOSIS — Z87891 Personal history of nicotine dependence: Secondary | ICD-10-CM | POA: Diagnosis not present

## 2019-12-07 DIAGNOSIS — S8992XA Unspecified injury of left lower leg, initial encounter: Secondary | ICD-10-CM

## 2019-12-07 NOTE — ED Triage Notes (Signed)
Pt complaint of left knee pain; "hyperextended my knee when blowing leaves on Thursday;" then when walking yesterday "felt a pop."

## 2019-12-07 NOTE — ED Provider Notes (Signed)
[facility]-EMERGENCY DEPT Provider Note   CSN: [id number] Arrival date & time: 12/07/19  1718     History Chief Complaint  Patient presents with  . Knee Injury    Jesus Wallace is a 73 y.o. male.  HPI      Jesus Wallace is a 73 y.o. male, with a history of chronic back pain, presenting to the ED with left knee injury that originally occurred 12/10. Patient states he was walking backwards while blowing leaves, his left heel ran into a curb, and he felt as though his left knee hyperextended.  He was having soreness in the knee and then yesterday while walking downhill, he heard and felt a pop in the left knee with increase in his pain.  Pain is aching and throbbing, mostly on the anterior portion of the knee "on the inside," moderate to severe, worse with ambulation and flexion, radiating throughout the knee. He has been taking ibuprofen, Neurontin, and oxycodone for his chronic back pain. He states he has an established relationship with Dr. Shelle Iron of [facility] and would like to stay with this practice. Denies head injury, neck/back pain, hip pain, ankle pain, neurologic deficits, color change, or any other complaints.   Past Medical History:  Diagnosis Date  . Arthritis    osteoarthritis  . BPH (benign prostatic hypertrophy)    states prostate is spongy  . Chronic lower back pain   . Depression   . Dupuytren's contracture of both hands 08/2013   bilateral long fingers  . Headache(784.0)    several weekly - since having cervical fusion  . Heart murmur    states is asymptomatic  . Impaired hearing   . Limited joint range of motion    in neck, since cervical fusion  . PONV (postoperative nausea and vomiting)   . Psoriasis    generalized  . Raynaud disease    right hand  . Right ankle injury 08/2013   states tendon is pulled away from bone    Patient Active Problem List   Diagnosis Date Noted  . 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 Trigger  finger, acquired 05/25/2011  . UMBILICAL HERNIA 10/11/2009  . PROSTATITIS, ACUTE 06/17/2009  . PERIPHERAL NEUROPATHY, FEET 04/25/2009  . CHEST PAIN, ATYPICAL 03/28/2009  . MIGRAINE, CHRONIC 07/12/2008  . Degeneration of cervical intervertebral disc 07/12/2008  . 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 12/23/2007  . UNS ADVRS EFF UNS RX MEDICINAL&BIOLOGICAL SBSTNC 12/23/2007  . OSTEOARTHRITIS 09/17/2007  . LOW BACK PAIN 09/17/2007  . ROTATOR CUFF SYNDROME, RIGHT 09/17/2007  . TRIGGER FINGER, LEFT MIDDLE 09/17/2007  . HYPERLIPIDEMIA 09/03/2007  . PSORIASIS 09/03/2007    Past Surgical History:  Procedure Laterality Date  . ANTERIOR CERVICAL DECOMP/DISCECTOMY FUSION  12/30/2008   C5-6, C6-7  . MASS EXCISION Left 03/10/2004   posterior chest  . ROTATOR CUFF REPAIR Right 06/13/2007; 02/16/2008  . SHOULDER ARTHROSCOPY WITH ROTATOR CUFF REPAIR Right 06/01/2010  . TRIGGER FINGER RELEASE Bilateral 09/03/2013   Procedure: RELEASE TRIGGER FINGER/A-1 PULLEY RIGHT AND LEFT LONG FINGERS;  Surgeon: Wyn Forster., MD;  Location: [facility];  Service: Orthopedics;  Laterality: Bilateral;  . UMBILICAL HERNIA REPAIR  01/04/2012   Procedure: HERNIA REPAIR UMBILICAL ADULT;  Surgeon: Shelly Rubenstein, MD;  Location: [facility] OR;  Service: General;  Laterality: N/A;  umbilical hernia repair        Family History  Problem Relation Age of Onset  . Heart disease Father   .  Diabetes Father   . Cancer Father        prostate  . Prostate cancer Other     Social History   Tobacco Use  . Smoking status: Former Smoker    Quit date: 09/16/1986    Years since quitting: 33.2  . Smokeless tobacco: Never Used  Substance Use Topics  . Alcohol use: Yes    Comment: occasionally  . Drug use: No    Home Medications Prior to Admission medications   Medication Sig Start Date End Date Taking? Authorizing Provider  Calcium Carbonate-Vitamin D (CALCIUM PLUS VITAMIN D PO) Take 1 tablet by mouth  daily.     [provider]  etanercept (ENBREL) 50 MG/ML injection Inject 50 mg into the skin once a week. sunday    [provider]  FLUoxetine (PROZAC) 20 MG tablet Take 20 mg by mouth 2 (two) times daily.     [provider]  folic acid (FOLVITE) 1 MG tablet Take 1 mg by mouth daily.      [provider]  gabapentin (NEURONTIN) 300 MG capsule Take 300 mg by mouth 3 (three) times daily.     [provider]  ibuprofen (ADVIL,MOTRIN) 200 MG tablet Take 200 mg by mouth 3 (three) times daily. For pain.    [provider]  Melatonin 5 MG CAPS Take 1 capsule by mouth at bedtime.      [provider]  mometasone (NASONEX) 50 MCG/ACT nasal spray Place 2 sprays into the nose daily as needed. For stuffy nose.    [provider]  morphine (MSIR) 15 MG tablet Take 15 mg by mouth 2 (two) times daily as needed. For pain.    [provider]  Multiple Vitamin (MULITIVITAMIN WITH MINERALS) TABS Take 1 tablet by mouth daily.     [provider]  Omega-3 Fatty Acids (FISH OIL) 1200 MG CAPS Take 1 capsule by mouth every evening.      [provider]  oxyCODONE (OXY IR/ROXICODONE) 5 MG immediate release tablet Take 5 mg by mouth every 4 (four) hours as needed for pain.    [provider]  oxyCODONE-acetaminophen (PERCOCET/ROXICET) 5-325 MG per tablet Take one or two tablets every 4 to 6 hours as needed for pain 09/03/13   Sypher, Molly Maduro, MD  predniSONE (DELTASONE) 5 MG tablet Take 5 mg by mouth daily. Every other day    [provider]  saw palmetto 500 MG capsule Take 900 mg by mouth daily.    [provider]  terazosin (HYTRIN) 1 MG capsule Take 1 mg by mouth at bedtime.    [provider]  traZODone (DESYREL) 100 MG tablet Take 100 mg by mouth at bedtime.    [provider]  Turmeric 500 MG CAPS Take 1 capsule by mouth daily.     [provider]  vitamin B-12  (CYANOCOBALAMIN) 1000 MCG tablet Take 1,000 mcg by mouth daily.     [provider]  vitamin C (ASCORBIC ACID) 500 MG tablet Take 500 mg by mouth daily.     [provider]  zolpidem (AMBIEN) 10 MG tablet Take 10 mg by mouth at bedtime as needed for sleep.    [provider]    Allergies    Patient has no known allergies.  Review of Systems   Review of Systems  Musculoskeletal: Positive for arthralgias and joint swelling. Negative for back pain and neck pain.  Neurological: Negative for weakness and numbness.  Physical Exam Updated Vital Signs BP (!) 158/69   Pulse 84   Temp 98.1 F (36.7 C) (Oral)   Resp 18   Ht 5\' 9"  (1.753 m)   Wt 86.2 kg   SpO2 96%   BMI 28.06 kg/m   Physical Exam Vitals and nursing note reviewed.  Constitutional:      General: He is not in acute distress.    Appearance: He is well-developed. He is not diaphoretic.  HENT:     Head: Normocephalic and atraumatic.  Eyes:     Conjunctiva/sclera: Conjunctivae normal.  Cardiovascular:     Rate and Rhythm: Normal rate and regular rhythm.     Pulses:          Dorsalis pedis pulses are 2+ on the left side.       Posterior tibial pulses are 2+ on the right side and 2+ on the left side.  Pulmonary:     Effort: Pulmonary effort is normal.  Musculoskeletal:     Cervical back: Neck supple.     Left knee: Swelling and effusion present. No deformity, erythema or crepitus. Normal alignment and normal patellar mobility.     Comments: Pain elicited with anterior drawer test, but without laxity. Limited range of motion due to pain. Compartments of the posterior knee and lower leg are soft. No tenderness, deformity, instability, or pain with range of motion of the left hip or ankle.  Skin:    General: Skin is warm and dry.     Coloration: Skin is not pale.  Neurological:     Mental Status: He is alert.     Comments: Sensation to light touch grossly intact in the left lower  extremity. Strength 5/5 in the left lower extremity.  Psychiatric:        Behavior: Behavior normal.     ED Results / Procedures / Treatments   Labs (all labs ordered are listed, but only abnormal results are displayed) Labs Reviewed - No data to display  EKG None  Radiology DG Knee Complete 4 Views Left  Result Date: 12/07/2019 CLINICAL DATA:  Left knee pain.  Hyperextension injury. EXAM: LEFT KNEE - COMPLETE 4+ VIEW COMPARISON:  None. FINDINGS: There is a small to moderate joint effusion. No acute bony abnormality. Specifically, no fracture, subluxation, or dislocation. Joint spaces maintained. IMPRESSION: Left knee joint effusion.  No acute bony abnormality. Electronically Signed   By: Rolm Baptise M.D.   On: 12/07/2019 20:02    Procedures Procedures (including critical care time)  Medications Ordered in ED Medications - No data to display  ED Course  I have reviewed the triage vital signs and the nursing notes.  Pertinent labs & imaging results that were available during my care of the patient were reviewed by me and considered in my medical decision making (see chart for details).    MDM Rules/Calculators/A&P                      Patient presents for evaluation following a knee injury.  No evidence of neurovascular compromise.  ACL injury is a consideration.  Effusion on x-ray. Patient already has crutches.  He was placed in a knee immobilizer.  Orthopedic follow-up. The patient was given instructions for home care as well as return precautions. Patient voices understanding of these instructions, accepts the plan, and is comfortable with discharge.   Final Clinical Impression(s) / ED Diagnoses Final diagnoses:  Injury of left knee, initial encounter  Rx / DC Orders ED Discharge Orders    None       Concepcion LivingJoy, Shalanda Brogden C, PA-C 12/07/19 2103    Lorre NickAllen, Anthony, MD 12/08/19 1126

## 2019-12-07 NOTE — Discharge Instructions (Signed)
You have been seen today for a knee injury. There were no acute abnormalities on the x-rays, including no sign of fracture or dislocation, however, there could be injuries to the soft tissues, such as the ligaments or tendons that are not seen on xrays. There could also be what are called occult fractures that are small fractures not seen on xray. Antiinflammatory medications: Take 600 mg of ibuprofen every 6 hours or 440 mg (over the counter dose) to 500 mg (prescription dose) of naproxen every 12 hours for the next 3 days. After this time, these medications may be used as needed for pain. Take these medications with food to avoid upset stomach. Choose only one of these medications, do not take them together. Acetaminophen (generic for Tylenol): Should you continue to have additional pain while taking the ibuprofen or naproxen, you may add in acetaminophen as needed. Your daily total maximum amount of acetaminophen from all sources should be limited to 4000mg /day for persons without liver problems, or 2000mg /day for those with liver problems. Ice: May apply ice to the area intermittently for 15 minutes at a time to reduce swelling and inflammation if you find this improves your pain. Elevation: Keep the extremity elevated as often as possible to reduce pain and inflammation. Support: Wear the knee immobilizer for support and comfort. Wear this until pain resolves. You will be weight-bearing as tolerated, which means you can slowly start to put weight on the extremity and increase amount and frequency as pain allows. Follow up: Follow-up with the orthopedic specialist as soon as possible for management of this issue.  Be sure to follow-up within the next couple weeks. Return: Return to the ED for numbness, weakness, increasing pain, overall worsening symptoms, loss of function, or if symptoms are not improving, you have tried to follow up with the orthopedic specialist, and have been unable to do so.  For  prescription assistance, may try using prescription discount sites or apps, such as goodrx.com

## 2020-02-17 ENCOUNTER — Ambulatory Visit: Payer: Self-pay | Admitting: Orthopedic Surgery

## 2020-02-17 NOTE — H&P (Signed)
Jesus Wallace is an 74 y.o. male.   Chief Complaint: L shoulder pain HPI: Visit For: Established patient, new problem Hand Dominance: right Location: left; shoulder Duration: years Quality: sharp Severity: pain level 3/10 Timing: chronic Context: Repetitive use of the shoulder over the years Aggravating Factors: lifting; twisting Previous Surgery: none Prior Imaging: MRI Previous Injections: helped temporarily Previous PT: did not help Patient reports pain only when he raises elbow up to shoulder height for example when he is turning the wheel on his riding lawnmower. He has had no problems with the shoulder until a recent event.  Past Medical History:  Diagnosis Date  . Arthritis    osteoarthritis  . BPH (benign prostatic hypertrophy)    states prostate is spongy  . Chronic lower back pain   . Depression   . Dupuytren's contracture of both hands 08/2013   bilateral long fingers  . Headache(784.0)    several weekly - since having cervical fusion  . Heart murmur    states is asymptomatic  . Impaired hearing   . Limited joint range of motion    in neck, since cervical fusion  . PONV (postoperative nausea and vomiting)   . Psoriasis    generalized  . Raynaud disease    right hand  . Right ankle injury 08/2013   states tendon is pulled away from bone    Past Surgical History:  Procedure Laterality Date  . ANTERIOR CERVICAL DECOMP/DISCECTOMY FUSION  12/30/2008   C5-6, C6-7  . MASS EXCISION Left 03/10/2004   posterior chest  . ROTATOR CUFF REPAIR Right 06/13/2007; 02/16/2008  . SHOULDER ARTHROSCOPY WITH ROTATOR CUFF REPAIR Right 06/01/2010  . TRIGGER FINGER RELEASE Bilateral 09/03/2013   Procedure: RELEASE TRIGGER FINGER/A-1 PULLEY RIGHT AND LEFT LONG FINGERS;  Surgeon: Cammie Sickle., MD;  Location: [facility];  Service: Orthopedics;  Laterality: Bilateral;  . UMBILICAL HERNIA REPAIR  01/04/2012   Procedure: HERNIA REPAIR UMBILICAL ADULT;  Surgeon: Harl Bowie, MD;  Location: [facility];  Service: General;  Laterality: N/A;  umbilical hernia repair     Family History  Problem Relation Age of Onset  . Heart disease Father   . Diabetes Father   . Cancer Father        prostate  . Prostate cancer Other    Social History:  reports that he quit smoking about 33 years ago. He has never used smokeless tobacco. He reports current alcohol use. He reports that he does not use drugs.  Allergies: No Known Allergies  (Not in a hospital admission)   No results found for this or any previous visit (from the past 48 hour(s)). No results found.  Review of Systems  Constitutional: Negative.   HENT: Negative.   Eyes: Negative.   Respiratory: Negative.   Cardiovascular: Negative.   Gastrointestinal: Negative.   Endocrine: Negative.   Genitourinary: Negative.   Musculoskeletal: Positive for arthralgias.  Neurological: Negative.   Hematological: Negative.   Psychiatric/Behavioral: Negative.     There were no vitals taken for this visit. Physical Exam  Constitutional: He is oriented to person, place, and time. He appears well-developed and well-nourished.  HENT:  Head: Normocephalic.  Eyes: Pupils are equal, round, and reactive to light.  Cardiovascular: Normal rate.  Respiratory: Effort normal.  GI: Soft.  Musculoskeletal:     Cervical back: Normal range of motion.     Comments: Patient is a 74 year old male.  No apparent distress. Nontender over the [facility] joint.  Nontender over the acromion is some mild tenderness and fullness in the anterior aspect of the arm. Active abduction is to 110. Internal rotation L4. Able to reach overhead. External rotation intact. Is a positive impingement sign positive secondary impingement sign. Good strength in internal rotation and external rotation. He has no pain in the deltopectoral groove. There is no instability in the shoulder noted.  Neurological: He is alert and oriented to person, place, and time.   Skin: Skin is warm and dry.    Outside MRI report demonstrates a rupture of the long head of the biceps tendon. A essentially a full-thickness tear of the supraspinatus or [place] or near full-thickness tear with 4 cm of retraction. High-grade tearing partially with infraspinatus moderate to severe glenohumeral arthritis particularly posteriorly. With degenerative fraying of the labrum. Moderate AC arthrosis. Moderate effusion patient neurovascularly intact.   Three-view x-rays demonstrate type II acromion. AC arthrosis. Some erosion of the posterior glenoid. With subchondral cyst. Mild subluxation of the Humeral head. This consistent with a full-thickness rotator cuff tear. He does have some high-grade articular tearing of the subscapularis.    Assessment/Plan Left shoulder pain multifactorial though primarily with impingement and symptomatic tear of the supraspinatus. He has a long head of the biceps tendon tear and retracted. He has some posterior glenohumeral arthrosis. Asymptomatic AC arthrosis.   Also has a moderate joint effusion with synovitis loose bodies. And subacromial bursitis.   Plan:   We discussed options including living with the symptoms she feels at this point time is fairly painful. Again be and primarily painful in the impingement type position discussed options and include a mini open rotator cuff Repair of the supraspinatus Lavage of the glenohumeral joint. Acromioplasty.   Of there is a total shoulder replacement. Or a partial such as a reverse. Is not interested in those options.   Indicated that the biceps tendon is not to be repaired and we discussed the rationale behind that.   An extensive discussion concerning the pathology relevant anatomy and treatment options. After that discussion we mutually agreed to proceed with repair of the rotator cuff utilizing arthroscopic assistance if possible. The risks and benefits of that procedure were discussed including  bleeding, infection, suboptimal range of motion, deep venous thrombosis, pulmonary embolism, anesthetic complications etc. in addition we discussed the postoperative course to include approximately 4 weeks of passive range of motion followed by 4 weeks of active range of motion followed by 4-12 weeks of progressive strengthening exercises. In addition we discussed protective activities to reduce the risk of a reinjury including impingement activities with elbow above the shoulder as well as reaching and repetitive circular motion activities. The hospital stay will either be as a outpatient with a regional block versus overnight depending upon the extent of the procedure and any challenging health issues with a first postoperative visit 2 weeks following the surgery.   I have asked him to secure a CT of the MRI for [place] to imported into our system so I can review that further and finalize the Procedure.   Plan L shoulder mini-open RCR, SAD, lavage  Dorothy Spark, PA-C for Dr. Shelle Iron 02/17/2020, 10:40 AM

## 2020-02-17 NOTE — H&P (View-Only) (Signed)
Jesus Wallace is an 74 y.o. male.   Chief Complaint: L shoulder pain HPI: Visit For: Established patient, new problem Hand Dominance: right Location: left; shoulder Duration: years Quality: sharp Severity: pain level 3/10 Timing: chronic Context: Repetitive use of the shoulder over the years Aggravating Factors: lifting; twisting Previous Surgery: none Prior Imaging: MRI Previous Injections: helped temporarily Previous PT: did not help Patient reports pain only when he raises elbow up to shoulder height for example when he is turning the wheel on his riding lawnmower. He has had no problems with the shoulder until a recent event.  Past Medical History:  Diagnosis Date  . Arthritis    osteoarthritis  . BPH (benign prostatic hypertrophy)    states prostate is spongy  . Chronic lower back pain   . Depression   . Dupuytren's contracture of both hands 08/2013   bilateral long fingers  . Headache(784.0)    several weekly - since having cervical fusion  . Heart murmur    states is asymptomatic  . Impaired hearing   . Limited joint range of motion    in neck, since cervical fusion  . PONV (postoperative nausea and vomiting)   . Psoriasis    generalized  . Raynaud disease    right hand  . Right ankle injury 08/2013   states tendon is pulled away from bone    Past Surgical History:  Procedure Laterality Date  . ANTERIOR CERVICAL DECOMP/DISCECTOMY FUSION  12/30/2008   C5-6, C6-7  . MASS EXCISION Left 03/10/2004   posterior chest  . ROTATOR CUFF REPAIR Right 06/13/2007; 02/16/2008  . SHOULDER ARTHROSCOPY WITH ROTATOR CUFF REPAIR Right 06/01/2010  . TRIGGER FINGER RELEASE Bilateral 09/03/2013   Procedure: RELEASE TRIGGER FINGER/A-1 PULLEY RIGHT AND LEFT LONG FINGERS;  Surgeon: Robert V Sypher Jr., MD;  Location: [facility];  Service: Orthopedics;  Laterality: Bilateral;  . UMBILICAL HERNIA REPAIR  01/04/2012   Procedure: HERNIA REPAIR UMBILICAL ADULT;  Surgeon: Douglas A  Blackman, MD;  Location: [facility] OR;  Service: General;  Laterality: N/A;  umbilical hernia repair     Family History  Problem Relation Age of Onset  . Heart disease Father   . Diabetes Father   . Cancer Father        prostate  . Prostate cancer Other    Social History:  reports that he quit smoking about 33 years ago. He has never used smokeless tobacco. He reports current alcohol use. He reports that he does not use drugs.  Allergies: No Known Allergies  (Not in a hospital admission)   No results found for this or any previous visit (from the past 48 hour(s)). No results found.  Review of Systems  Constitutional: Negative.   HENT: Negative.   Eyes: Negative.   Respiratory: Negative.   Cardiovascular: Negative.   Gastrointestinal: Negative.   Endocrine: Negative.   Genitourinary: Negative.   Musculoskeletal: Positive for arthralgias.  Neurological: Negative.   Hematological: Negative.   Psychiatric/Behavioral: Negative.     There were no vitals taken for this visit. Physical Exam  Constitutional: He is oriented to person, place, and time. He appears well-developed and well-nourished.  HENT:  Head: Normocephalic.  Eyes: Pupils are equal, round, and reactive to light.  Cardiovascular: Normal rate.  Respiratory: Effort normal.  GI: Soft.  Musculoskeletal:     Cervical back: Normal range of motion.     Comments: Patient is a 74-year-old male.  No apparent distress. Nontender over the AC joint.   Nontender over the acromion is some mild tenderness and fullness in the anterior aspect of the arm. Active abduction is to 110. Internal rotation L4. Able to reach overhead. External rotation intact. Is a positive impingement sign positive secondary impingement sign. Good strength in internal rotation and external rotation. He has no pain in the deltopectoral groove. There is no instability in the shoulder noted.  Neurological: He is alert and oriented to person, place, and time.   Skin: Skin is warm and dry.    Outside MRI report demonstrates a rupture of the long head of the biceps tendon. A essentially a full-thickness tear of the supraspinatus or Nork or near full-thickness tear with 4 cm of retraction. High-grade tearing partially with infraspinatus moderate to severe glenohumeral arthritis particularly posteriorly. With degenerative fraying of the labrum. Moderate AC arthrosis. Moderate effusion patient neurovascularly intact.   Three-view x-rays demonstrate type II acromion. AC arthrosis. Some erosion of the posterior glenoid. With subchondral cyst. Mild subluxation of the Humeral head. This consistent with a full-thickness rotator cuff tear. He does have some high-grade articular tearing of the subscapularis.    Assessment/Plan Left shoulder pain multifactorial though primarily with impingement and symptomatic tear of the supraspinatus. He has a long head of the biceps tendon tear and retracted. He has some posterior glenohumeral arthrosis. Asymptomatic AC arthrosis.   Also has a moderate joint effusion with synovitis loose bodies. And subacromial bursitis.   Plan:   We discussed options including living with the symptoms she feels at this point time is fairly painful. Again be and primarily painful in the impingement type position discussed options and include a mini open rotator cuff Repair of the supraspinatus Lavage of the glenohumeral joint. Acromioplasty.   Of there is a total shoulder replacement. Or a partial such as a reverse. Is not interested in those options.   Indicated that the biceps tendon is not to be repaired and we discussed the rationale behind that.   An extensive discussion concerning the pathology relevant anatomy and treatment options. After that discussion we mutually agreed to proceed with repair of the rotator cuff utilizing arthroscopic assistance if possible. The risks and benefits of that procedure were discussed including  bleeding, infection, suboptimal range of motion, deep venous thrombosis, pulmonary embolism, anesthetic complications etc. in addition we discussed the postoperative course to include approximately 4 weeks of passive range of motion followed by 4 weeks of active range of motion followed by 4-12 weeks of progressive strengthening exercises. In addition we discussed protective activities to reduce the risk of a reinjury including impingement activities with elbow above the shoulder as well as reaching and repetitive circular motion activities. The hospital stay will either be as a outpatient with a regional block versus overnight depending upon the extent of the procedure and any challenging health issues with a first postoperative visit 2 weeks following the surgery.   I have asked him to secure a CT of the MRI for [place] to imported into our system so I can review that further and finalize the Procedure.   Plan L shoulder mini-open RCR, SAD, lavage  Dorothy Spark, PA-C for Dr. Shelle Iron 02/17/2020, 10:40 AM

## 2020-02-22 NOTE — Patient Instructions (Addendum)
DUE TO COVID-19 ONLY ONE VISITOR IS ALLOWED TO COME WITH YOU AND STAY IN THE WAITING ROOM ONLY DURING PRE OP AND PROCEDURE DAY OF SURGERY. THE 1 VISITOR MAY VISIT WITH YOU AFTER SURGERY IN YOUR PRIVATE ROOM DURING VISITING HOURS ONLY!  YOU NEED TO HAVE A COVID 19 TEST ON_3/6/21______ @_10 :30______, THIS TEST MUST BE DONE BEFORE SURGERY, COME  [address],  [place] , .  [facility]) ONCE YOUR COVID TEST IS COMPLETED, PLEASE BEGIN THE QUARANTINE INSTRUCTIONS AS OUTLINED IN YOUR HANDOUT.                [facility]    Your procedure is scheduled on: 03/02/20   Report to [facility] Main  Entrance   Report to admitting at    6:30  AM     Call this number if you have problems the morning of surgery [phone]   BRUSH YOUR TEETH MORNING OF SURGERY AND RINSE YOUR MOUTH OUT, NO CHEWING GUM CANDY OR MINTS.  Do not eat food After Midnight.   YOU MAY HAVE CLEAR LIQUIDS FROM MIDNIGHT UNTIL 5:30 AM.   At 5:30 AM Please finish the prescribed Pre-Surgery drink.   Nothing by mouth after you finish the drink !    Take these medicines the morning of surgery with A SIP OF WATER: Gabapentin, Elavil, Venlafaxine, Flonase                                You may not have any metal on your body including              piercings  Do not wear jewelry,  lotions, powders or  deodorant              Men may shave face and neck.   Do not bring valuables to the hospital. [place] IS NOT             RESPONSIBLE   FOR VALUABLES.  Contacts, dentures or bridgework may not be worn into surgery.      Patients discharged the day of surgery will not be allowed to drive home.  IF YOU ARE HAVING SURGERY AND GOING HOME THE SAME DAY, YOU MUST HAVE AN ADULT TO DRIVE YOU HOME AND BE WITH YOU FOR 24 HOURS.  YOU MAY GO HOME BY TAXI OR UBER OR ORTHERWISE, BUT AN ADULT MUST ACCOMPANY YOU HOME AND STAY WITH YOU FOR 24 HOURS.  Name and phone number of your driver:  Special  Instructions: N/A              Please read over the following fact sheets you were given: _____________________________________________________________________             [facility] - Preparing for Surgery Before surgery, you can play an important role.   Because skin is not sterile, your skin needs to be as free of germs as possible.   You can reduce the number of germs on your skin by washing with CHG (chlorahexidine gluconate) soap before surgery.   CHG is an antiseptic cleaner which kills germs and bonds with the skin to continue killing germs even after washing. Please DO NOT use if you have an allergy to CHG or antibacterial soaps.   If your skin becomes reddened/irritated stop using the CHG and inform your nurse when you arrive at Short Stay. .  You may shave your face/neck.  Please follow these instructions carefully:  1.  Shower with CHG Soap the night before surgery and the  morning of Surgery.  2.  If you choose to wash your hair, wash your hair first as usual with your  normal  shampoo.  3.  After you shampoo, rinse your hair and body thoroughly to remove the  shampoo.                                        4.  Use CHG as you would any other liquid soap.  You can apply chg directly  to the skin and wash                       Gently with a scrungie or clean washcloth.  5.  Apply the CHG Soap to your body ONLY FROM THE NECK DOWN.   Do not use on face/ open                           Wound or open sores. Avoid contact with eyes, ears mouth and genitals (private parts).                       Wash face,  Genitals (private parts) with your normal soap.             6.  Wash thoroughly, paying special attention to the area where your surgery  will be performed.  7.  Thoroughly rinse your body with warm water from the neck down.  8.  DO NOT shower/wash with your normal soap after using and rinsing off  the CHG Soap.             9.  Pat yourself dry with a clean towel.            10.   Wear clean pajamas.            11.  Place clean sheets on your bed the night of your first shower and do not  sleep with pets. Day of Surgery : Do not apply any lotions/deodorants the morning of surgery.  Please wear clean clothes to the hospital/surgery center.  FAILURE TO FOLLOW THESE INSTRUCTIONS MAY RESULT IN THE CANCELLATION OF YOUR SURGERY PATIENT SIGNATURE_________________________________  NURSE SIGNATURE__________________________________  ________________________________________________________________________

## 2020-02-23 ENCOUNTER — Encounter (HOSPITAL_COMMUNITY): Payer: Self-pay

## 2020-02-23 ENCOUNTER — Encounter (HOSPITAL_COMMUNITY)
Admission: RE | Admit: 2020-02-23 | Discharge: 2020-02-23 | Disposition: A | Discharge status: Home / Self Care | Payer: No Typology Code available for payment source | Source: Ambulatory Visit | Attending: Specialist | Admitting: Specialist

## 2020-02-23 ENCOUNTER — Other Ambulatory Visit: Payer: Self-pay

## 2020-02-23 DIAGNOSIS — Z01812 Encounter for preprocedural laboratory examination: Secondary | ICD-10-CM | POA: Insufficient documentation

## 2020-02-23 LAB — BASIC METABOLIC PANEL
Anion gap: 8 (ref 5–15)
BUN: 17 mg/dL (ref 8–23)
CO2: 30 mmol/L (ref 22–32)
Calcium: 8.6 mg/dL — ABNORMAL LOW (ref 8.9–10.3)
Chloride: 99 mmol/L (ref 98–111)
Creatinine, Ser: 0.95 mg/dL (ref 0.61–1.24)
GFR calc Af Amer: >60 mL/min (ref >60)
GFR calc non Af Amer: >60 mL/min (ref >60)
Glucose, Bld: 95 mg/dL (ref 70–99)
Potassium: 5 mmol/L (ref 3.5–5.1)
Sodium: 137 mmol/L (ref 135–145)

## 2020-02-23 LAB — CBC
HCT: 42 % (ref 39.0–52.0)
Hemoglobin: 14.4 g/dL (ref 13.0–17.0)
MCH: 33.1 pg (ref 26.0–34.0)
MCHC: 34.3 g/dL (ref 30.0–36.0)
MCV: 96.6 fL (ref 80.0–100.0)
Platelets: 220 10*3/uL (ref 150–400)
RBC: 4.35 MIL/uL (ref 4.22–5.81)
RDW: 12 % (ref 11.5–15.5)
WBC: 5.7 10*3/uL (ref 4.0–10.5)
nRBC: 0 % (ref 0.0–0.2)

## 2020-02-23 LAB — SURGICAL PCR SCREEN
MRSA, PCR: NEGATIVE
Staphylococcus aureus: POSITIVE — AB

## 2020-02-23 NOTE — Progress Notes (Addendum)
PCP - Dr. Angus Palms at the [facility] Cardiologist - Pt doesn't know the name. Dr. At the [facility].  Chest x-ray -  EKG -  Stress Test -  ECHO -  Cardiac Cath -   Sleep Study - no CPAP -   Fasting Blood Sugar - no Checks Blood Sugar _____ times a day  Blood Thinner Instructions:ASA- Dr. Helene Shoe. Pt states it is for pain Aspirin Instructions:none given to pt. He said he will call Dr. Shelle Iron Last Dose:02/22/20  Anesthesia review:   Patient denies shortness of breath, fever, cough and chest pain at PAT appointment yes  Patient verbalized understanding of instructions that were given to them at the PAT appointment. Patient was also instructed that they will need to review over the PAT instructions again at home before surgery. Yes Pt said he had a cervical fusion but has good ROM in his neck.

## 2020-02-27 ENCOUNTER — Other Ambulatory Visit (HOSPITAL_COMMUNITY)
Admission: RE | Admit: 2020-02-27 | Discharge: 2020-02-27 | Disposition: A | Discharge status: Home / Self Care | Payer: Medicare Other | Source: Ambulatory Visit | Attending: Specialist | Admitting: Specialist

## 2020-02-27 DIAGNOSIS — Z20822 Contact with and (suspected) exposure to covid-19: Secondary | ICD-10-CM | POA: Insufficient documentation

## 2020-02-27 DIAGNOSIS — Z01812 Encounter for preprocedural laboratory examination: Secondary | ICD-10-CM | POA: Diagnosis present

## 2020-02-27 LAB — SARS CORONAVIRUS 2 (TAT 6-24 HRS): SARS Coronavirus 2: NEGATIVE

## 2020-03-02 ENCOUNTER — Encounter (HOSPITAL_COMMUNITY)
Admission: RE | Disposition: A | Discharge status: Home / Self Care | Payer: Self-pay | Source: Other Acute Inpatient Hospital | Attending: Specialist

## 2020-03-02 ENCOUNTER — Encounter (HOSPITAL_COMMUNITY): Payer: Self-pay | Admitting: Specialist

## 2020-03-02 ENCOUNTER — Ambulatory Visit (HOSPITAL_COMMUNITY): Payer: No Typology Code available for payment source | Admitting: Physician Assistant

## 2020-03-02 ENCOUNTER — Ambulatory Visit (HOSPITAL_COMMUNITY): Payer: No Typology Code available for payment source | Admitting: Anesthesiology

## 2020-03-02 ENCOUNTER — Ambulatory Visit (HOSPITAL_COMMUNITY)
Admission: RE | Admit: 2020-03-02 | Discharge: 2020-03-02 | Disposition: A | Discharge status: Home / Self Care | Payer: No Typology Code available for payment source | Source: Other Acute Inpatient Hospital | Attending: Specialist | Admitting: Specialist

## 2020-03-02 DIAGNOSIS — M7542 Impingement syndrome of left shoulder: Secondary | ICD-10-CM | POA: Insufficient documentation

## 2020-03-02 DIAGNOSIS — S46112A Strain of muscle, fascia and tendon of long head of biceps, left arm, initial encounter: Secondary | ICD-10-CM | POA: Diagnosis not present

## 2020-03-02 DIAGNOSIS — Z87891 Personal history of nicotine dependence: Secondary | ICD-10-CM | POA: Insufficient documentation

## 2020-03-02 DIAGNOSIS — M75120 Complete rotator cuff tear or rupture of unspecified shoulder, not specified as traumatic: Secondary | ICD-10-CM | POA: Diagnosis present

## 2020-03-02 DIAGNOSIS — M7552 Bursitis of left shoulder: Secondary | ICD-10-CM | POA: Diagnosis not present

## 2020-03-02 DIAGNOSIS — H9190 Unspecified hearing loss, unspecified ear: Secondary | ICD-10-CM | POA: Insufficient documentation

## 2020-03-02 DIAGNOSIS — S46012A Strain of muscle(s) and tendon(s) of the rotator cuff of left shoulder, initial encounter: Secondary | ICD-10-CM | POA: Insufficient documentation

## 2020-03-02 DIAGNOSIS — M65812 Other synovitis and tenosynovitis, left shoulder: Secondary | ICD-10-CM | POA: Insufficient documentation

## 2020-03-02 DIAGNOSIS — M24012 Loose body in left shoulder: Secondary | ICD-10-CM | POA: Insufficient documentation

## 2020-03-02 DIAGNOSIS — M19012 Primary osteoarthritis, left shoulder: Secondary | ICD-10-CM | POA: Diagnosis not present

## 2020-03-02 DIAGNOSIS — X58XXXA Exposure to other specified factors, initial encounter: Secondary | ICD-10-CM | POA: Insufficient documentation

## 2020-03-02 HISTORY — PX: SHOULDER ARTHROSCOPY WITH ROTATOR CUFF REPAIR AND SUBACROMIAL DECOMPRESSION: SHX5686

## 2020-03-02 SURGERY — SHOULDER ARTHROSCOPY WITH ROTATOR CUFF REPAIR AND SUBACROMIAL DECOMPRESSION
Anesthesia: Regional | Laterality: Left

## 2020-03-02 MED ORDER — MENTHOL 3 MG MT LOZG: 1.0000 | LOZENGE | OROMUCOSAL | Status: DC | PRN

## 2020-03-02 MED ORDER — ONDANSETRON HCL 4 MG PO TABS: 4.0000 mg | ORAL_TABLET | Freq: Four times a day (QID) | ORAL | Status: DC | PRN

## 2020-03-02 MED ORDER — ROCURONIUM BROMIDE 100 MG/10ML IV SOLN
INTRAVENOUS | Status: DC | PRN
  Administered 2020-03-02: 10 mg via INTRAVENOUS
  Administered 2020-03-02: 60 mg via INTRAVENOUS
  Administered 2020-03-02: 10 mg via INTRAVENOUS

## 2020-03-02 MED ORDER — ONDANSETRON HCL 4 MG/2ML IJ SOLN
INTRAMUSCULAR | Status: DC | PRN
  Administered 2020-03-02: 4 mg via INTRAVENOUS

## 2020-03-02 MED ORDER — MAGNESIUM CITRATE PO SOLN: 1.0000 | Freq: Once | ORAL | Status: DC | PRN

## 2020-03-02 MED ORDER — CEFAZOLIN SODIUM-DEXTROSE 2-4 GM/100ML-% IV SOLN
2.0000 g | INTRAVENOUS | Status: AC
  Administered 2020-03-02: 2 g via INTRAVENOUS
  Filled 2020-03-02: qty 100

## 2020-03-02 MED ORDER — ADULT MULTIVITAMIN W/MINERALS CH: 1.0000 | ORAL_TABLET | Freq: Every day | ORAL | Status: DC

## 2020-03-02 MED ORDER — OMEGA-3-ACID ETHYL ESTERS 1 G PO CAPS: 1.0000 g | ORAL_CAPSULE | Freq: Every day | ORAL | Status: DC

## 2020-03-02 MED ORDER — TERAZOSIN HCL 1 MG PO CAPS
1.0000 mg | ORAL_CAPSULE | Freq: Every day | ORAL | Status: DC
  Filled 2020-03-02: qty 1

## 2020-03-02 MED ORDER — EPHEDRINE SULFATE 50 MG/ML IJ SOLN
INTRAMUSCULAR | Status: DC | PRN
  Administered 2020-03-02 (×3): 10 mg via INTRAVENOUS

## 2020-03-02 MED ORDER — DEXAMETHASONE SODIUM PHOSPHATE 10 MG/ML IJ SOLN
INTRAMUSCULAR | Status: DC | PRN
  Administered 2020-03-02: 8 mg via INTRAVENOUS

## 2020-03-02 MED ORDER — DEXAMETHASONE SODIUM PHOSPHATE 10 MG/ML IJ SOLN
INTRAMUSCULAR | Status: AC
  Filled 2020-03-02: qty 1

## 2020-03-02 MED ORDER — METHOCARBAMOL 500 MG PO TABS: 500.0000 mg | ORAL_TABLET | Freq: Four times a day (QID) | ORAL | Status: DC | PRN

## 2020-03-02 MED ORDER — PHENOL 1.4 % MT LIQD: 1.0000 | OROMUCOSAL | Status: DC | PRN

## 2020-03-02 MED ORDER — ACETAMINOPHEN 500 MG PO TABS: 1000.0000 mg | ORAL_TABLET | Freq: Once | ORAL | Status: DC | PRN

## 2020-03-02 MED ORDER — LIDOCAINE 2% (20 MG/ML) 5 ML SYRINGE
INTRAMUSCULAR | Status: AC
  Filled 2020-03-02: qty 5

## 2020-03-02 MED ORDER — KCL IN DEXTROSE-NACL 20-5-0.45 MEQ/L-%-% IV SOLN: INTRAVENOUS | Status: DC

## 2020-03-02 MED ORDER — METHOCARBAMOL 500 MG PO TABS: 500.0000 mg | ORAL_TABLET | Freq: Four times a day (QID) | ORAL | 1 refills | Status: DC | PRN

## 2020-03-02 MED ORDER — DOCUSATE SODIUM 100 MG PO CAPS: 100.0000 mg | ORAL_CAPSULE | Freq: Two times a day (BID) | ORAL | Status: DC

## 2020-03-02 MED ORDER — METHOCARBAMOL 1000 MG/10ML IJ SOLN
500.0000 mg | Freq: Four times a day (QID) | INTRAVENOUS | Status: DC | PRN
  Filled 2020-03-02: qty 5

## 2020-03-02 MED ORDER — MIDAZOLAM HCL 2 MG/2ML IJ SOLN
INTRAMUSCULAR | Status: AC
  Filled 2020-03-02: qty 2

## 2020-03-02 MED ORDER — SODIUM CHLORIDE 0.9 % IV SOLN
INTRAVENOUS | Status: DC | PRN
  Administered 2020-03-02: 500 mL

## 2020-03-02 MED ORDER — ONDANSETRON HCL 4 MG/2ML IJ SOLN
INTRAMUSCULAR | Status: AC
  Filled 2020-03-02: qty 2

## 2020-03-02 MED ORDER — BUPIVACAINE-EPINEPHRINE 0.5% -1:200000 IJ SOLN
INTRAMUSCULAR | Status: DC | PRN
  Administered 2020-03-02: 10 mL

## 2020-03-02 MED ORDER — AMITRIPTYLINE HCL 50 MG PO TABS
50.0000 mg | ORAL_TABLET | Freq: Every day | ORAL | Status: DC
  Filled 2020-03-02: qty 1

## 2020-03-02 MED ORDER — HYDROCODONE-ACETAMINOPHEN 5-325 MG PO TABS: 1.0000 | ORAL_TABLET | ORAL | Status: DC | PRN

## 2020-03-02 MED ORDER — METHOCARBAMOL 500 MG PO TABS: 500.0000 mg | ORAL_TABLET | Freq: Four times a day (QID) | ORAL | 1 refills | Status: AC | PRN

## 2020-03-02 MED ORDER — GABAPENTIN 300 MG PO CAPS: 300.0000 mg | ORAL_CAPSULE | Freq: Three times a day (TID) | ORAL | Status: DC | PRN

## 2020-03-02 MED ORDER — POLYETHYLENE GLYCOL 3350 17 G PO PACK: 17.0000 g | PACK | Freq: Every day | ORAL | Status: DC | PRN

## 2020-03-02 MED ORDER — CHLORHEXIDINE GLUCONATE 4 % EX LIQD: 60.0000 mL | Freq: Once | CUTANEOUS | Status: DC

## 2020-03-02 MED ORDER — ZOLPIDEM TARTRATE 5 MG PO TABS: 5.0000 mg | ORAL_TABLET | Freq: Every day | ORAL | Status: DC

## 2020-03-02 MED ORDER — ALUM & MAG HYDROXIDE-SIMETH 200-200-20 MG/5ML PO SUSP: 30.0000 mL | ORAL | Status: DC | PRN

## 2020-03-02 MED ORDER — ENOXAPARIN SODIUM 40 MG/0.4ML ~~LOC~~ SOLN: 40.0000 mg | SUBCUTANEOUS | Status: DC

## 2020-03-02 MED ORDER — BISACODYL 5 MG PO TBEC: 5.0000 mg | DELAYED_RELEASE_TABLET | Freq: Every day | ORAL | Status: DC | PRN

## 2020-03-02 MED ORDER — ACETAMINOPHEN 325 MG PO TABS: 325.0000 mg | ORAL_TABLET | Freq: Four times a day (QID) | ORAL | Status: DC | PRN

## 2020-03-02 MED ORDER — PHENYLEPHRINE HCL (PRESSORS) 10 MG/ML IV SOLN
INTRAVENOUS | Status: AC
  Filled 2020-03-02: qty 1

## 2020-03-02 MED ORDER — LACTATED RINGERS IV SOLN: INTRAVENOUS | Status: DC

## 2020-03-02 MED ORDER — HYDROCODONE-ACETAMINOPHEN 7.5-325 MG PO TABS: 1.0000 | ORAL_TABLET | ORAL | Status: DC | PRN

## 2020-03-02 MED ORDER — SUGAMMADEX SODIUM 200 MG/2ML IV SOLN
INTRAVENOUS | Status: DC | PRN
  Administered 2020-03-02: 200 mg via INTRAVENOUS

## 2020-03-02 MED ORDER — OXYCODONE HCL 5 MG PO TABS: 5.0000 mg | ORAL_TABLET | Freq: Once | ORAL | Status: DC | PRN

## 2020-03-02 MED ORDER — LIDOCAINE HCL (CARDIAC) PF 100 MG/5ML IV SOSY
PREFILLED_SYRINGE | INTRAVENOUS | Status: DC | PRN
  Administered 2020-03-02: 60 mg via INTRAVENOUS

## 2020-03-02 MED ORDER — SODIUM CHLORIDE 0.9 % IV SOLN
INTRAVENOUS | Status: AC
  Filled 2020-03-02: qty 500000

## 2020-03-02 MED ORDER — ACETAMINOPHEN 160 MG/5ML PO SOLN: 1000.0000 mg | Freq: Once | ORAL | Status: DC | PRN

## 2020-03-02 MED ORDER — DIPHENHYDRAMINE HCL 12.5 MG/5ML PO ELIX: 12.5000 mg | ORAL_SOLUTION | ORAL | Status: DC | PRN

## 2020-03-02 MED ORDER — FENTANYL CITRATE (PF) 100 MCG/2ML IJ SOLN: 25.0000 ug | INTRAMUSCULAR | Status: DC | PRN

## 2020-03-02 MED ORDER — BUPIVACAINE HCL (PF) 0.5 % IJ SOLN
INTRAMUSCULAR | Status: DC | PRN
  Administered 2020-03-02: 15 mL via PERINEURAL

## 2020-03-02 MED ORDER — FENTANYL CITRATE (PF) 100 MCG/2ML IJ SOLN
INTRAMUSCULAR | Status: AC
  Filled 2020-03-02: qty 2

## 2020-03-02 MED ORDER — ACETAMINOPHEN 10 MG/ML IV SOLN: 1000.0000 mg | Freq: Once | INTRAVENOUS | Status: DC | PRN

## 2020-03-02 MED ORDER — FENTANYL CITRATE (PF) 100 MCG/2ML IJ SOLN
INTRAMUSCULAR | Status: DC | PRN
  Administered 2020-03-02: 50 ug via INTRAVENOUS

## 2020-03-02 MED ORDER — CEFAZOLIN SODIUM-DEXTROSE 2-4 GM/100ML-% IV SOLN
2.0000 g | Freq: Once | INTRAVENOUS | Status: AC
  Administered 2020-03-02: 2 g via INTRAVENOUS
  Filled 2020-03-02: qty 100

## 2020-03-02 MED ORDER — ALBUMIN HUMAN 5 % IV SOLN
INTRAVENOUS | Status: AC
  Filled 2020-03-02: qty 250

## 2020-03-02 MED ORDER — OXYCODONE-ACETAMINOPHEN 5-325 MG PO TABS: 1.0000 | ORAL_TABLET | ORAL | 0 refills | Status: AC | PRN

## 2020-03-02 MED ORDER — ONDANSETRON HCL 4 MG/2ML IJ SOLN: 4.0000 mg | Freq: Four times a day (QID) | INTRAMUSCULAR | Status: DC | PRN

## 2020-03-02 MED ORDER — PROPOFOL 10 MG/ML IV BOLUS
INTRAVENOUS | Status: DC | PRN
  Administered 2020-03-02: 160 mg via INTRAVENOUS

## 2020-03-02 MED ORDER — METOCLOPRAMIDE HCL 5 MG PO TABS: 5.0000 mg | ORAL_TABLET | Freq: Three times a day (TID) | ORAL | Status: DC | PRN

## 2020-03-02 MED ORDER — FLUTICASONE PROPIONATE 50 MCG/ACT NA SUSP
1.0000 | Freq: Every day | NASAL | Status: DC
  Filled 2020-03-02: qty 16

## 2020-03-02 MED ORDER — ROCURONIUM BROMIDE 10 MG/ML (PF) SYRINGE
PREFILLED_SYRINGE | INTRAVENOUS | Status: AC
  Filled 2020-03-02: qty 10

## 2020-03-02 MED ORDER — MUSCLE RUB 10-15 % EX CREA
1.0000 "application " | TOPICAL_CREAM | Freq: Three times a day (TID) | CUTANEOUS | Status: DC | PRN
  Filled 2020-03-02: qty 85

## 2020-03-02 MED ORDER — METOCLOPRAMIDE HCL 5 MG/ML IJ SOLN: 5.0000 mg | Freq: Three times a day (TID) | INTRAMUSCULAR | Status: DC | PRN

## 2020-03-02 MED ORDER — PROPOFOL 10 MG/ML IV BOLUS
INTRAVENOUS | Status: AC
  Filled 2020-03-02: qty 20

## 2020-03-02 MED ORDER — MORPHINE SULFATE (PF) 2 MG/ML IV SOLN: 0.5000 mg | INTRAVENOUS | Status: DC | PRN

## 2020-03-02 MED ORDER — FOLIC ACID 1 MG PO TABS: 1.0000 mg | ORAL_TABLET | Freq: Every day | ORAL | Status: DC

## 2020-03-02 MED ORDER — BUPIVACAINE LIPOSOME 1.3 % IJ SUSP
INTRAMUSCULAR | Status: DC | PRN
  Administered 2020-03-02: 133 mg via PERINEURAL

## 2020-03-02 MED ORDER — OXYCODONE HCL 5 MG/5ML PO SOLN: 5.0000 mg | Freq: Once | ORAL | Status: DC | PRN

## 2020-03-02 MED ORDER — VENLAFAXINE HCL ER 150 MG PO CP24: 300.0000 mg | ORAL_CAPSULE | Freq: Every evening | ORAL | Status: DC

## 2020-03-02 MED ORDER — MIDAZOLAM HCL 5 MG/5ML IJ SOLN
INTRAMUSCULAR | Status: DC | PRN
  Administered 2020-03-02: 1 mg via INTRAVENOUS

## 2020-03-02 MED ORDER — BUPIVACAINE-EPINEPHRINE 0.5% -1:200000 IJ SOLN
INTRAMUSCULAR | Status: AC
  Filled 2020-03-02: qty 1

## 2020-03-02 MED ORDER — ALBUMIN HUMAN 5 % IV SOLN: INTRAVENOUS | Status: DC | PRN

## 2020-03-02 MED ORDER — SAW PALMETTO (SERENOA REPENS) 500 MG PO CAPS: 500.0000 mg | ORAL_CAPSULE | Freq: Every day | ORAL | Status: DC

## 2020-03-02 MED ORDER — PHENYLEPHRINE HCL-NACL 10-0.9 MG/250ML-% IV SOLN
INTRAVENOUS | Status: DC | PRN
  Administered 2020-03-02: 75 ug/min via INTRAVENOUS

## 2020-03-02 MED ORDER — ACETAMINOPHEN 500 MG PO TABS: 500.0000 mg | ORAL_TABLET | Freq: Four times a day (QID) | ORAL | Status: DC

## 2020-03-02 MED ORDER — 0.9 % SODIUM CHLORIDE (POUR BTL) OPTIME
TOPICAL | Status: DC | PRN
  Administered 2020-03-02: 1000 mL

## 2020-03-02 SURGICAL SUPPLY — 67 items
AID PSTN UNV HD RSTRNT DISP (MISCELLANEOUS)
ANCH SUT SWLK 19.1 CLS EYLT TL (Anchor) ×2 IMPLANT
ANCH SUT SWLK 19.1X4.75 (Anchor) ×2 IMPLANT
ANCHOR NDL 9/16 CIR SZ 8 (NEEDLE) IMPLANT
ANCHOR NEEDLE 9/16 CIR SZ 8 (NEEDLE) IMPLANT
ANCHOR SUT BIO SW 4.75 W/FIB (Anchor) ×4 IMPLANT
ANCHOR SUT BIO SW 4.75X19.1 (Anchor) ×4 IMPLANT
BLADE SURG SZ11 CARB STEEL (BLADE) ×3 IMPLANT
CANNULA ACUFO 5X76 (CANNULA) ×3 IMPLANT
CLEANER TIP ELECTROSURG 2X2 (MISCELLANEOUS) IMPLANT
CLOSURE WOUND 1/2 X4 (GAUZE/BANDAGES/DRESSINGS)
COVER SURGICAL LIGHT HANDLE (MISCELLANEOUS) ×3 IMPLANT
COVER WAND RF STERILE (DRAPES) IMPLANT
DRAPE ORTHO SPLIT 77X108 STRL (DRAPES) ×3
DRAPE POUCH INSTRU U-SHP 10X18 (DRAPES) IMPLANT
DRAPE STERI 35X30 U-POUCH (DRAPES) ×3 IMPLANT
DRAPE SURG ORHT 6 SPLT 77X108 (DRAPES) ×1 IMPLANT
DRESSING AQUACEL AG SP 3.5X4 (GAUZE/BANDAGES/DRESSINGS) IMPLANT
DRSG AQUACEL AG ADV 3.5X 4 (GAUZE/BANDAGES/DRESSINGS) IMPLANT
DRSG AQUACEL AG ADV 3.5X 6 (GAUZE/BANDAGES/DRESSINGS) IMPLANT
DRSG AQUACEL AG SP 3.5X4 (GAUZE/BANDAGES/DRESSINGS) ×3
DRSG PAD ABDOMINAL 8X10 ST (GAUZE/BANDAGES/DRESSINGS) IMPLANT
DURAPREP 26ML APPLICATOR (WOUND CARE) ×3 IMPLANT
ELECT NDL TIP 2.8 STRL (NEEDLE) IMPLANT
ELECT NEEDLE TIP 2.8 STRL (NEEDLE) IMPLANT
ELECT REM PT RETURN 15FT ADLT (MISCELLANEOUS) ×3 IMPLANT
FILTER STRAW (MISCELLANEOUS) ×3 IMPLANT
GLOVE BIOGEL PI IND STRL 7.5 (GLOVE) ×1 IMPLANT
GLOVE BIOGEL PI INDICATOR 7.5 (GLOVE) ×2
GLOVE SURG SS PI 7.5 STRL IVOR (GLOVE) ×6 IMPLANT
GLOVE SURG SS PI 8.0 STRL IVOR (GLOVE) ×6 IMPLANT
GOWN STRL REUS W/TWL XL LVL3 (GOWN DISPOSABLE) ×6 IMPLANT
KIT BASIN OR (CUSTOM PROCEDURE TRAY) ×3 IMPLANT
KIT POSITION SHOULDER SCHLEI (MISCELLANEOUS) ×3 IMPLANT
KIT TURNOVER KIT A (KITS) IMPLANT
MANIFOLD NEPTUNE II (INSTRUMENTS) ×3 IMPLANT
NDL SCORPION MULTI FIRE (NEEDLE) IMPLANT
NDL SPNL 18GX3.5 QUINCKE PK (NEEDLE) ×1 IMPLANT
NEEDLE SCORPION MULTI FIRE (NEEDLE) ×3 IMPLANT
NEEDLE SPNL 18GX3.5 QUINCKE PK (NEEDLE) ×3 IMPLANT
PACK SHOULDER (CUSTOM PROCEDURE TRAY) ×3 IMPLANT
PENCIL SMOKE EVACUATOR (MISCELLANEOUS) IMPLANT
PROTECTOR NERVE ULNAR (MISCELLANEOUS) ×3 IMPLANT
RESTRAINT HEAD UNIVERSAL NS (MISCELLANEOUS) IMPLANT
SLING ARM IMMOBILIZER LRG (SOFTGOODS) IMPLANT
SLING ARM IMMOBILIZER MED (SOFTGOODS) IMPLANT
SLING ULTRA II L (ORTHOPEDIC SUPPLIES) IMPLANT
STRIP CLOSURE SKIN 1/2X4 (GAUZE/BANDAGES/DRESSINGS) IMPLANT
SUCTION FRAZIER HANDLE 12FR (TUBING) ×3
SUCTION TUBE FRAZIER 12FR DISP (TUBING) ×1 IMPLANT
SUT ETHIBOND NAB CT1 #1 30IN (SUTURE) IMPLANT
SUT ETHILON 4 0 PS 2 18 (SUTURE) IMPLANT
SUT FIBERWIRE #2 38 T-5 BLUE (SUTURE)
SUT PROLENE 3 0 PS 2 (SUTURE) IMPLANT
SUT TIGER TAPE 7 IN WHITE (SUTURE) IMPLANT
SUT VIC AB 0 CT2 27 (SUTURE) ×2 IMPLANT
SUT VIC AB 1-0 CT2 27 (SUTURE) IMPLANT
SUT VIC AB 2-0 CT2 27 (SUTURE) IMPLANT
SUTURE FIBERWR #2 38 T-5 BLUE (SUTURE) IMPLANT
TAPE FIBER 2MM 7IN #2 BLUE (SUTURE) ×4 IMPLANT
TAPE STRIPS DRAPE STRL (GAUZE/BANDAGES/DRESSINGS) ×2 IMPLANT
TOWEL OR 17X26 10 PK STRL BLUE (TOWEL DISPOSABLE) ×3 IMPLANT
TUBING ARTHROSCOPY IRRIG 16FT (MISCELLANEOUS) ×3 IMPLANT
TUBING CONNECTING 10 (TUBING) IMPLANT
TUBING CONNECTING 10' (TUBING)
WAND HAND CNTRL MULTIVAC 90 (MISCELLANEOUS) IMPLANT
WIPE CHG CHLORHEXIDINE 2% (PERSONAL CARE ITEMS) ×3 IMPLANT

## 2020-03-02 NOTE — Anesthesia Procedure Notes (Signed)
Anesthesia Regional Block: Interscalene brachial plexus block   Pre-Anesthetic Checklist: ,, timeout performed, Correct Patient, Correct Site, Correct Laterality, Correct Procedure, Correct Position, site marked, Risks and benefits discussed,  Surgical consent,  Pre-op evaluation,  At surgeon's request and post-op pain management  Laterality: Left and Upper  Prep: chloraprep       Needles:  Injection technique: Single-shot     Needle Length: 5cm  Needle Gauge: 22     Additional Needles: Arrow StimuQuik ECHO Echogenic Stimulating PNB Needle  Procedures:,,,, ultrasound used (permanent image in chart),,,,  Narrative:  Start time: 03/02/2020 8:11 AM End time: 03/02/2020 8:17 AM Injection made incrementally with aspirations every 5 mL.  Performed by: Personally  Anesthesiologist: Val Eagle, MD

## 2020-03-02 NOTE — Anesthesia Procedure Notes (Signed)
Procedure Name: Intubation Date/Time: 03/02/2020 8:34 AM Performed by: Thornell Mule, CRNA Pre-anesthesia Checklist: Patient identified, Emergency Drugs available, Suction available and Patient being monitored Patient Re-evaluated:Patient Re-evaluated prior to induction Oxygen Delivery Method: Circle system utilized Preoxygenation: Pre-oxygenation with 100% oxygen Induction Type: IV induction Ventilation: Mask ventilation without difficulty Laryngoscope Size: Miller and 3 Grade View: Grade I Tube type: Oral Tube size: 7.5 mm Number of attempts: 1 Airway Equipment and Method: Stylet and Oral airway Placement Confirmation: ETT inserted through vocal cords under direct vision,  positive ETCO2 and breath sounds checked- equal and bilateral Secured at: 22 cm Tube secured with: Tape Dental Injury: Teeth and Oropharynx as per pre-operative assessment

## 2020-03-02 NOTE — Progress Notes (Signed)
PHARMACIST - PHYSICIAN ORDER COMMUNICATION  CONCERNING: P&T Medication Policy on Herbal Medications  DESCRIPTION:  This patient's order for:  Saw Palmetto  has been noted.  This product(s) is classified as an "herbal" or natural product. Due to a lack of definitive safety studies or FDA approval, nonstandard manufacturing practices, plus the potential risk of unknown drug-drug interactions while on inpatient medications, the Pharmacy and Therapeutics Committee does not permit the use of "herbal" or natural products of this type within [facility].   ACTION TAKEN: The pharmacy department is unable to verify this order at this time and your patient has been informed of this safety policy. Please reevaluate patient's clinical condition at discharge and address if the herbal or natural product(s) should be resumed at that time.  Lynann Beaver PharmD, BCPS Clinical pharmacist phone 7am- 5pm: [phone] 03/02/2020 12:01 PM

## 2020-03-02 NOTE — Anesthesia Preprocedure Evaluation (Addendum)
Anesthesia Evaluation  Patient identified by MRN, date of birth, ID band Patient awake    Reviewed: Allergy & Precautions, NPO status , Patient's Chart, lab work & pertinent test results  History of Anesthesia Complications (+) PONV and history of anesthetic complications  Airway Mallampati: III  TM Distance: >3 FB Neck ROM: Full    Dental  (+) Teeth Intact, Dental Advisory Given,    Pulmonary neg recent URI, former smoker,    breath sounds clear to auscultation       Cardiovascular (-) angina(-) Past MI and (-) CHF negative cardio ROS   Rhythm:Regular     Neuro/Psych  Headaches, neg Seizures PSYCHIATRIC DISORDERS Depression    GI/Hepatic negative GI ROS, Neg liver ROS,   Endo/Other  negative endocrine ROS  Renal/GU negative Renal ROS     Musculoskeletal  (+) Arthritis ,   Abdominal   Peds  Hematology negative hematology ROS (+)   Anesthesia Other Findings   Reproductive/Obstetrics                             Anesthesia Physical Anesthesia Plan  ASA: II  Anesthesia Plan: General and Regional   Post-op Pain Management:  Regional for Post-op pain   Induction: Intravenous  PONV Risk Score and Plan: 3 and Ondansetron and Dexamethasone  Airway Management Planned: Oral ETT  Additional Equipment: None  Intra-op Plan:   Post-operative Plan: Extubation in OR  Informed Consent: I have reviewed the patients History and Physical, chart, labs and discussed the procedure including the risks, benefits and alternatives for the proposed anesthesia with the patient or authorized representative who has indicated his/her understanding and acceptance.     Dental advisory given  Plan Discussed with: CRNA and Surgeon  Anesthesia Plan Comments:         Anesthesia Quick Evaluation

## 2020-03-02 NOTE — Op Note (Signed)
NAME: Jesus Wallace, Jesus Wallace. MEDICAL RECORD ZO:[id number] ACCOUNT [id number] DATE OF BIRTH:06-02-1946 FACILITY: [facility] LOCATION: [id number] PHYSICIAN:Sabir Charters Windy Kalata, MD  OPERATIVE REPORT  DATE OF PROCEDURE:  03/02/2020  PREOPERATIVE DIAGNOSES:   1.  Retracted tear of the rotator cuff, left shoulder, supraspinatus, infraspinatus. 2.  Biceps tendon tear, left shoulder.  PROCEDURE PERFORMED: 1.  Mini open rotator cuff repair utilizing PushLock suture anchors. 2.  Subacromial decompression and bursectomy. 3.  Glenohumeral lavage.  ANESTHESIA:  General.  ASSISTANT:  Lacie Draft, PA  HISTORY:  A 74 year old male who had a shoulder injury, rotator cuff tear last year.  He came for an opinion.  He had a rotator cuff tear, retracted supraspinatus, some of the infraspinatus, split tearing of the subscapularis.  The biceps tendon was  noted to be torn and retracted into the proximal humerus.  The patient had pain with abduction.  Weakness in external rotation.  He had moderate glenohumeral arthrosis.  We discussed living with the symptoms versus shoulder hemiarthroplasty versus a  rotator cuff repair.  Chosen the latter.  We discussed a mini open rotator cuff repair, subacromial decompression, and repair of the rotator cuff.  Patient had requested, if possible, repair of the biceps tendon.  We discussed that would be unlikely.   This will be evaluated intraoperatively.  Risks and benefits discussed including bleeding, infection, damage to neurovascular structures, no change in symptoms, worsening symptoms, DVT, PE, anesthetic complications, etc.  TECHNIQUE:  With the patient in supine beachchair position after induction of adequate general anesthesia, 2 grams Kefzol, left upper extremity and shoulder girdle was prepped and draped in the usual sterile fashion.  A surgical marker was utilized to  delineate the acromion, AC joint, and coracoid.  Standard anterolateral incision was made over the anterolateral  corner of the acromion approximately 3 cm in length.  Subcutaneous tissue was dissected with electrocautery to achieve hemostasis.  Marcaine  0.25% with epinephrine was infiltrated in the tissues for hemostasis.  The rhaphe between the anterior and lateral heads of the deltoid was identified and divided in line with skin incision.  A self-retaining retractor was placed.  Hypertrophic bursa was  noted.  This was excised.  I then placed a self-retaining retractor.  Copiously irrigated the joint as a retracted tear of the rotator cuff was noted with exposure of the humeral head.  After copious irrigation lavage of the joint I placed a  self-retaining retractor.  I preserved the CA ligament.  A small spur off the anterolateral aspect of the acromion was removed with a 3 mm Kerrison forming a micro acromioplasty just for access.  I digitally lysed adhesions.  Inspection revealed a  retracted tear of the supraspinatus.  There was a torn portion of the infraspinatus near its insertion and a stump of the biceps tendon was noted.  An approximately 1/3 of the biceps tendon was actually still intact extending down to the bicipital  groove.  I attempted to mobilize the biceps proximally; however, this would not advance, but was a potential tenodesis.  Again, though the biceps tendon did not advance proximally out of its bicipital groove expecting that it had been there for a period  of time.  There was scarring from the biceps tendon.  After multiple configurations we mobilized the supraspinatus and the infraspinatus.  The anterior half with attempted mobilization was just shredded tendon.  The posterior half was at least intact.   After mobilization on its bursal side and articular side I was able to  advance it 1.5 cm or so.  This would not reach the greater tuberosity.  I therefore removed approximately a centimeter of the lateral aspect of the articular surface with a bare  rongeur to good bone.  This was done  lateral to the greater tuberosity.  I then placed 2 suture anchors along this border after the pilot hole with an awl.  The posterior one was placed under the anterior 1/3 of the infraspinatus.  The 2nd one was placed  anterior to that 2 cm.  Good resistance to pullout for both of those.  We used a TigerTape and a Scorpion suture passer and I passed it posteriorly through the infraspinatus in 2 separate passes.  This was advanced 1.5 cm or so and anteriorly with trial  tensioning.  Next, the posterior half of the supraspinatus was engaged as well with the 2nd SwiveLock.  After placement of the TigerTape we used the Scorpion and passed 2 into the supraspinatus.  Again, this was advanced to the cancellous bone trough.   Both of these suture edges were then placed in a 2nd row with an awl piloting a hole in the greater tuberosity for both insertion of another SwiveLock.  Excellent resistance to pullout.  With the arm in very slight abduction these were placed in the 2nd  SwiveLock.  Without undue tension, inserted our SwiveLock with excellent resistance to pullout.  This covered the humeral head posteriorly.  I felt it reattached the infraspinatus and a portion of the supraspinatus.  Redundant suture was removed.  Now we  looked at the anterior part of the supraspinatus with that gap.  I did not feel that it was bridgeable by a patch graft.  I filled the subscap, although on the MRI, so there was some high-grade tearing, I felt it was for all intents and purposes intact  lateral to the bicipital groove.  Second was then what to do with the biceps tendon.  One option was to mobilize the stump and use that to cover the defect anteriorly and possibly tenodesing the distal aspect that was retracted again though this was not  mobilized.  I was not able to mobilize that proximally to tenodesis.  Again, with a 1/3 of the tendon intact and attaching to the stump of the biceps tendon there was some curvature anteriorly  afforded by this tissue as well as another restraint to the  humeral head.  I felt therefore that this was best left intact.  Again, we did not advance the supraspinatus or the infraspinatus much at all and care was taken not to over tighten to exacerbate his glenohumeral arthrosis.  Copiously irrigated the wound.   Again inspected and reevaluated the anterior gap, again with good coverage with the remaining tissue of the infraspinatus.  On the posterior half of the supraspinatus and the subscap there was at least moderate coverage of the humeral head.  After  copious irrigation, there was no active bleeding.  I then repaired the raphae with #1 Vicryl interrupted figure-of-eight sutures, subcu with 2-0 and skin with Prolene.  Sterile dressing was applied.  Placed in an abduction pillow, extubated without  difficulty and transported to the recovery room in satisfactory condition.  The patient tolerated this procedure well.  No complications.    Blood loss was 20 mL.  CN/NUANCE  D:03/02/2020 T:03/02/2020 JOB:[id number]

## 2020-03-02 NOTE — Interval H&P Note (Signed)
History and Physical Interval Note:  03/02/2020 8:08 AM  Jesus Wallace  has presented today for surgery, with the diagnosis of Left shoulder rotator cuff tear.  The various methods of treatment have been discussed with the patient and family. After consideration of risks, benefits and other options for treatment, the patient has consented to  Procedure(s) with comments: Left shoulder mini open rotator cuff repair, subacromial decompression, lavage (Left) - 2 hrs as a surgical intervention.  The patient's history has been reviewed, patient examined, no change in status, stable for surgery.  I have reviewed the patient's chart and labs.  Questions were answered to the patient's satisfaction.     Javier Docker

## 2020-03-02 NOTE — Discharge Instructions (Signed)

## 2020-03-02 NOTE — Brief Op Note (Signed)
03/02/2020  10:25 AM  PATIENT:  Jesus Wallace  74 y.o. male  PRE-OPERATIVE DIAGNOSIS:  Left shoulder rotator cuff tear  POST-OPERATIVE DIAGNOSIS:  Left shoulder rotator cuff tear  PROCEDURE:  Procedure(s) with comments: Left shoulder mini open rotator cuff repair, subacromial decompression, lavage (Left) - 2 hrs  SURGEON:  Surgeon(s) and Role:    Jene Every, MD - Primary  PHYSICIAN ASSISTANT:   ASSISTANTS: Bissell   ANESTHESIA:   general  EBL:  20   BLOOD ADMINISTERED:none  DRAINS: none   LOCAL MEDICATIONS USED:  MARCAINE     SPECIMEN:  no  DISPOSITION OF SPECIMEN:  N/A  COUNTS:  YES  TOURNIQUET:  * No tourniquets in log *  DICTATION: .Other Dictation: Dictation Number     [id number]  PLAN OF CARE: Admit for overnight observation  PATIENT DISPOSITION:  PACU - hemodynamically stable.   Delay start of Pharmacological VTE agent (>24hrs) due to surgical blood loss or risk of bleeding: no

## 2020-03-02 NOTE — Transfer of Care (Signed)
Immediate Anesthesia Transfer of Care Note  Patient: Jesus Wallace  Procedure(s) Performed: Left shoulder mini open rotator cuff repair, subacromial decompression, lavage (Left )  Patient Location: PACU  Anesthesia Type:General  Level of Consciousness: drowsy, patient cooperative and responds to stimulation  Airway & Oxygen Therapy: Patient Spontanous Breathing and Patient connected to face mask oxygen  Post-op Assessment: Report given to RN and Post -op Vital signs reviewed and stable  Post vital signs: Reviewed and stable  Last Vitals:  Vitals Value Taken Time  BP    Temp    Pulse 75 03/02/20 1041  Resp    SpO2 100 % 03/02/20 1041  Vitals shown include unvalidated device data.  Last Pain:  Vitals:   03/02/20 0717  TempSrc:   PainSc: 0-No pain         Complications: No apparent anesthesia complications

## 2020-03-03 ENCOUNTER — Encounter: Payer: Self-pay | Admitting: *Deleted

## 2020-03-06 NOTE — Anesthesia Postprocedure Evaluation (Signed)
Anesthesia Post Note  Patient: Jesus Wallace  Procedure(s) Performed: Left shoulder mini open rotator cuff repair, subacromial decompression, lavage (Left )     Patient location during evaluation: PACU Anesthesia Type: Regional and General Level of consciousness: awake and alert Pain management: pain level controlled Vital Signs Assessment: post-procedure vital signs reviewed and stable Respiratory status: spontaneous breathing, nonlabored ventilation, respiratory function stable and patient connected to nasal cannula oxygen Cardiovascular status: blood pressure returned to baseline and stable Postop Assessment: no apparent nausea or vomiting Anesthetic complications: no    Last Vitals:  Vitals:   03/02/20 1240 03/02/20 1344  BP: 129/70 123/81  Pulse: 73 80  Resp: 16 17  Temp: (!) 36.3 C (!) 35.6 C  SpO2: 97% 97%    Last Pain:  Vitals:   03/02/20 1623  TempSrc:   PainSc: 0-No pain                 Alianna Wurster

## 2021-09-04 IMAGING — CR DG KNEE COMPLETE 4+V*L*
4 series · 4 of 4 positions shown · non-contrast
Comparison: None.

CLINICAL DATA: Left knee pain.  Hyperextension injury.

EXAM:
LEFT KNEE - COMPLETE 4+ VIEW

[t knee ap left]
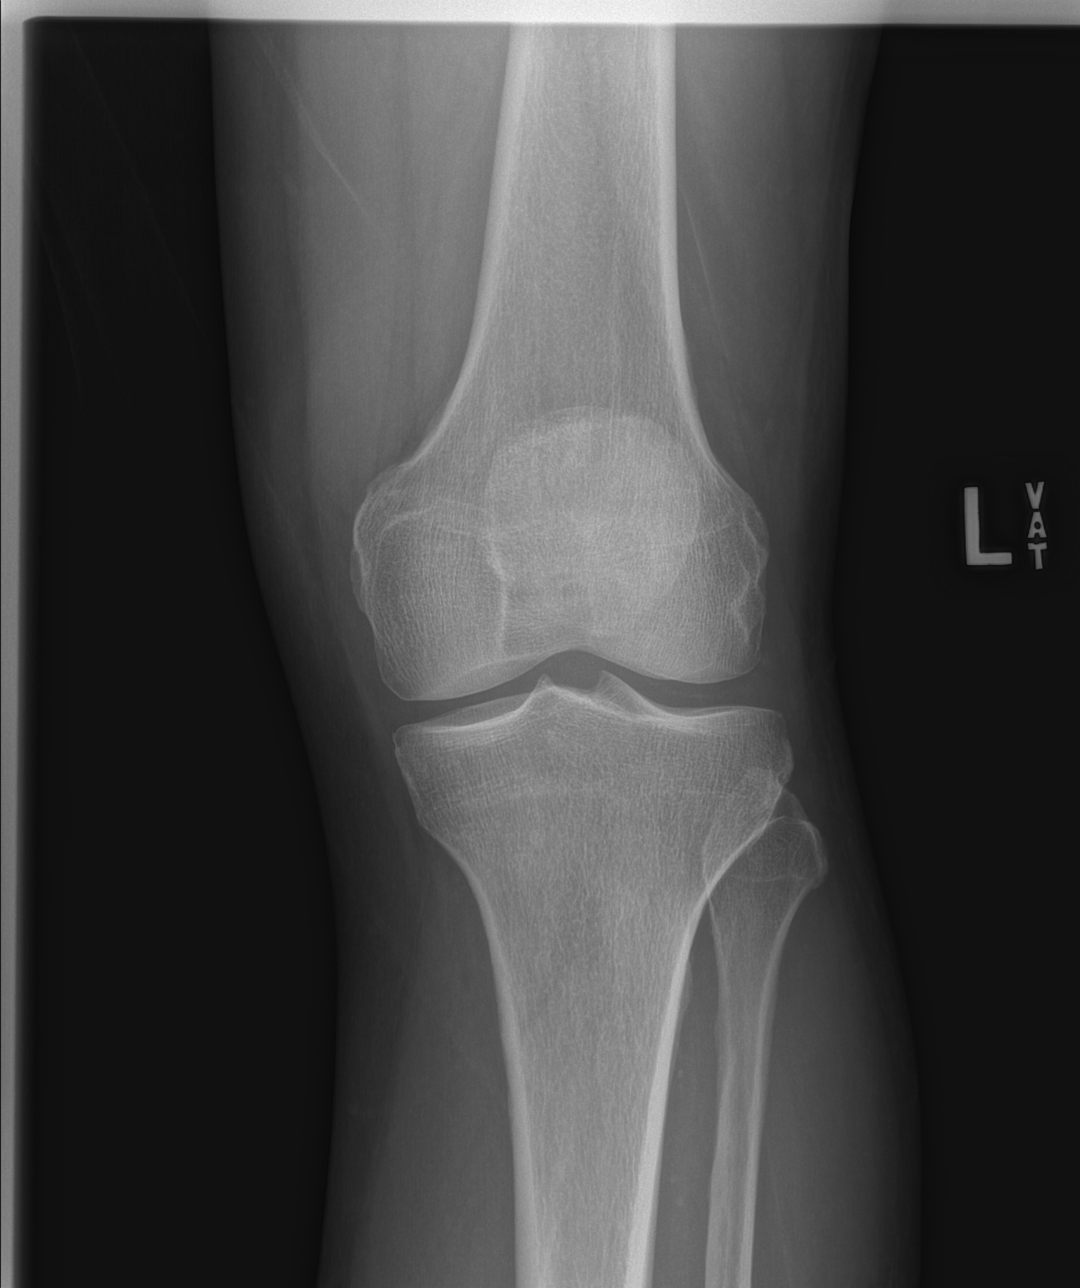

[t knee obl left (1 of 2)]
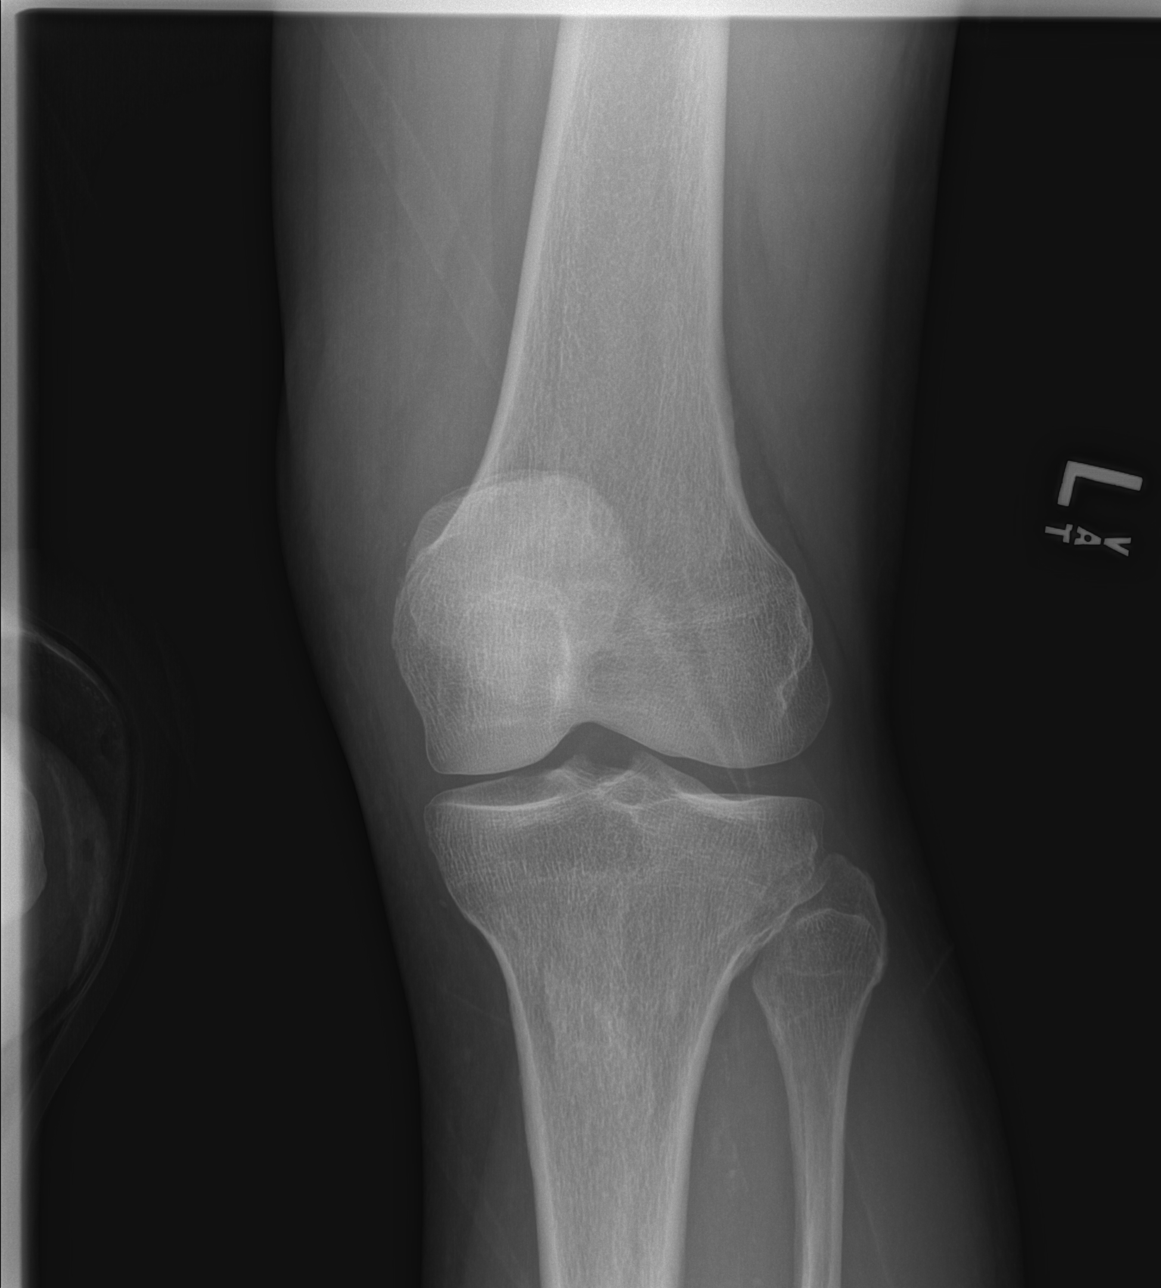

[t knee obl left (2 of 2)]
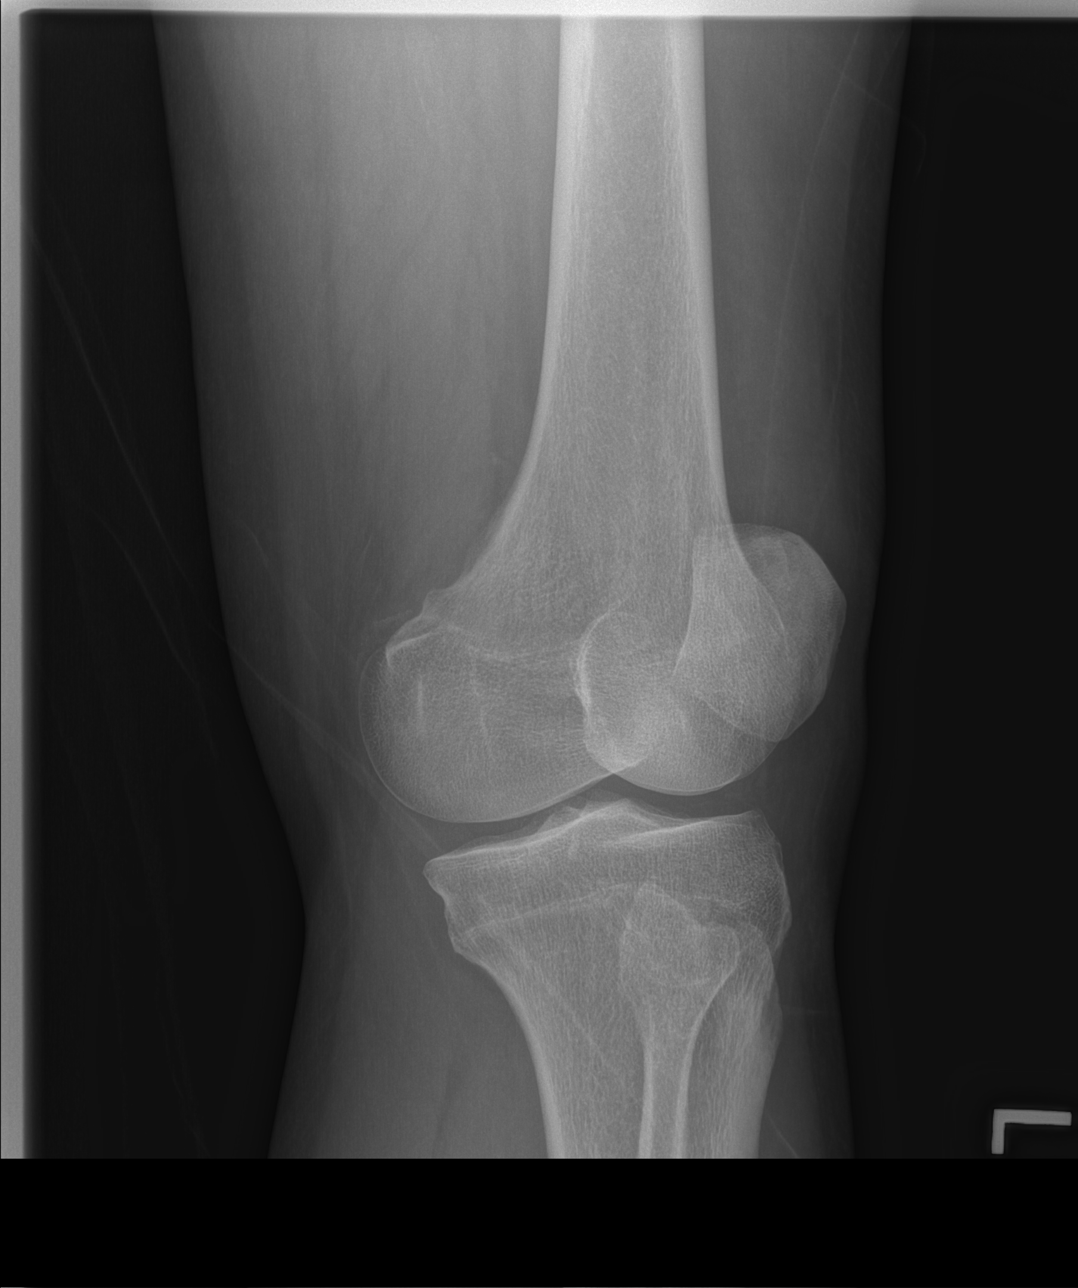

[t knee lat left]
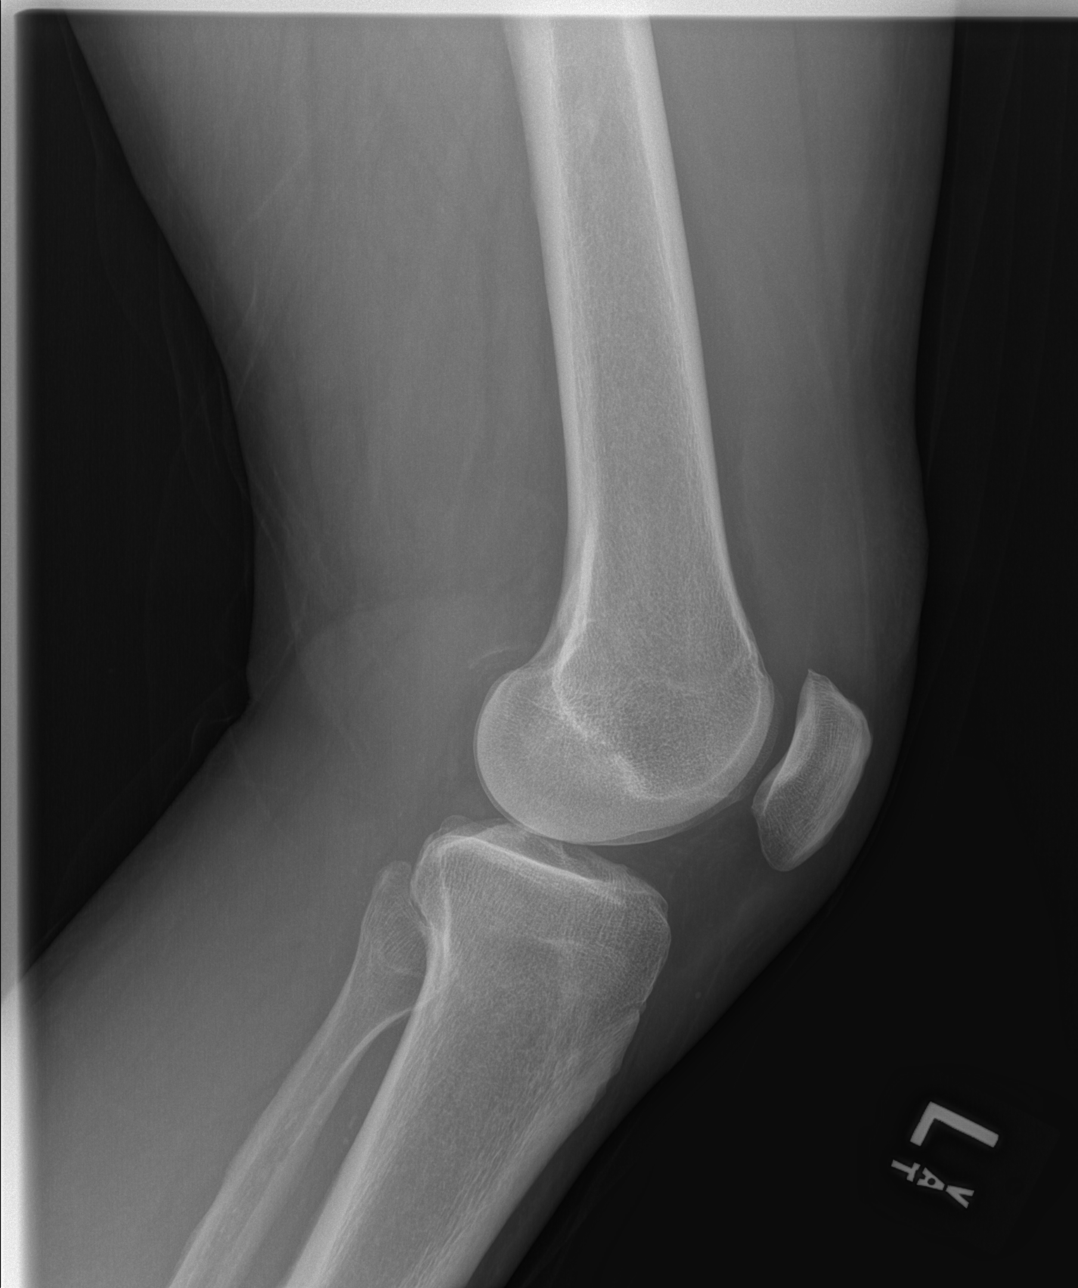

[4 of 4 positions shown; findings below may reference images not displayed]

FINDINGS: There is a small to moderate joint effusion. No acute bony
abnormality. Specifically, no fracture, subluxation, or dislocation.
Joint spaces maintained.
IMPRESSION: Left knee joint effusion.  No acute bony abnormality.

## 2024-03-01 ENCOUNTER — Ambulatory Visit
Admission: EM | Admit: 2024-03-01 | Discharge: 2024-03-01 | Disposition: A | Discharge status: Home / Self Care | Attending: Internal Medicine | Admitting: Internal Medicine

## 2024-03-01 ENCOUNTER — Other Ambulatory Visit: Payer: Self-pay

## 2024-03-01 ENCOUNTER — Encounter: Payer: Self-pay | Admitting: *Deleted

## 2024-03-01 DIAGNOSIS — J029 Acute pharyngitis, unspecified: Secondary | ICD-10-CM | POA: Insufficient documentation

## 2024-03-01 HISTORY — DX: Malignant (primary) neoplasm, unspecified: C80.1

## 2024-03-01 HISTORY — DX: Malignant neoplasm of prostate: C61

## 2024-03-01 LAB — POCT RAPID STREP A (OFFICE): Rapid Strep A Screen: NEGATIVE

## 2024-03-01 MED ORDER — AMOXICILLIN 500 MG PO CAPS: 500.0000 mg | ORAL_CAPSULE | Freq: Two times a day (BID) | ORAL | 0 refills | Status: AC

## 2024-03-01 NOTE — Discharge Instructions (Addendum)
 I have prescribed you an antibiotic for symptoms.  Throat culture is pending.  Will call if it is abnormal.  Please follow-up if any symptoms persist or worsen.

## 2024-03-01 NOTE — ED Triage Notes (Addendum)
 Sore throat, joint aches, and subjective fever since Thursday. Some cough, decreased appetite. Neg home covid test yesterday. He had tylenol earlier today

## 2024-03-01 NOTE — ED Provider Notes (Signed)
 [facility]    CSN: [id number] Arrival date & time: 03/01/24  1405      History   Chief Complaint Chief Complaint  Patient presents with   Sore Throat    HPI Jesus Wallace is a 78 y.o. male.   Patient presents with sore throat and mild cough that started about 3 days ago.  Reports that he has felt feverish but did not take temperature with thermometer.  Reports that he has not had any obvious known sick contacts but has been working around a daycare recently.  He has taken Tylenol for symptoms.  Denies history of asthma or COPD.   Sore Throat    Past Medical History:  Diagnosis Date   Arthritis    osteoarthritis   BPH (benign prostatic hypertrophy)    states prostate is spongy   Cancer (HCC)    Chronic lower back pain    Depression    Dupuytren's contracture of both hands 08/24/2013   bilateral long fingers   Headache(784.0)    several weekly - since having cervical fusion   Heart murmur    states is asymptomatic   Impaired hearing    Limited joint range of motion    in neck, since cervical fusion   PONV (postoperative nausea and vomiting)    Prostate cancer (HCC)    Psoriasis    generalized   Raynaud disease    right hand   Right ankle injury 08/24/2013   states tendon is pulled away from bone    Patient Active Problem List   Diagnosis Date Noted   Complete rotator cuff tear 03/02/2020   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   Trigger finger, acquired 05/25/2011   UMBILICAL HERNIA 10/11/2009   PROSTATITIS, ACUTE 06/17/2009   PERIPHERAL NEUROPATHY, FEET 04/25/2009   CHEST PAIN, ATYPICAL 03/28/2009   MIGRAINE, CHRONIC 07/12/2008   Degeneration of cervical intervertebral disc 07/12/2008   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 12/23/2007   UNS ADVRS EFF UNS RX MEDICINAL&BIOLOGICAL SBSTNC 12/23/2007   OSTEOARTHRITIS 09/17/2007   LOW BACK PAIN 09/17/2007   ROTATOR CUFF SYNDROME, RIGHT 09/17/2007   TRIGGER FINGER, LEFT MIDDLE 09/17/2007    HYPERLIPIDEMIA 09/03/2007   PSORIASIS 09/03/2007    Past Surgical History:  Procedure Laterality Date   ANTERIOR CERVICAL DECOMP/DISCECTOMY FUSION  12/30/2008   C5-6, C6-7   MASS EXCISION Left 03/10/2004   posterior chest   ROTATOR CUFF REPAIR Right 06/13/2007; 02/16/2008   SHOULDER ARTHROSCOPY WITH ROTATOR CUFF REPAIR Right 06/01/2010   SHOULDER ARTHROSCOPY WITH ROTATOR CUFF REPAIR AND SUBACROMIAL DECOMPRESSION Left 03/02/2020   Procedure: Left shoulder mini open rotator cuff repair, subacromial decompression, lavage;  Surgeon: Jene Every, MD;  Location: [facility];  Service: Orthopedics;  Laterality: Left;  2 hrs   TRIGGER FINGER RELEASE Bilateral 09/03/2013   Procedure: RELEASE TRIGGER FINGER/A-1 PULLEY RIGHT AND LEFT LONG FINGERS;  Surgeon: Wyn Forster., MD;  Location: [facility];  Service: Orthopedics;  Laterality: Bilateral;   UMBILICAL HERNIA REPAIR  01/04/2012   Procedure: HERNIA REPAIR UMBILICAL ADULT;  Surgeon: Shelly Rubenstein, MD;  Location: [facility];  Service: General;  Laterality: N/A;  umbilical hernia repair        Home Medications    Prior to Admission medications   Medication Sig Start Date End Date Taking? Authorizing Provider  amoxicillin (AMOXIL) 500 MG capsule Take 1 capsule (500 mg total) by mouth 2 (two) times daily for 10 days. 03/01/24 03/11/24 Yes Ervin Knack  E, FNP  amitriptyline (ELAVIL) 50 MG tablet Take 50 mg by mouth at bedtime.    [provider]  aspirin 81 MG chewable tablet Chew 81 mg by mouth daily as needed for mild pain.    [provider]  diclofenac Sodium (VOLTAREN) 1 % GEL Apply 2 g topically 3 (three) times daily as needed (pain).    [provider]  etanercept (ENBREL) 50 MG/ML injection Inject 50 mg into the skin once a week.     [provider]  fluticasone (FLONASE) 50 MCG/ACT nasal spray Place into both nostrils daily.    [provider]  folic acid (FOLVITE) 1 MG tablet Take 1  mg by mouth daily.      [provider]  gabapentin (NEURONTIN) 300 MG capsule Take 300 mg by mouth 3 (three) times daily as needed (pain).     [provider]  ibuprofen (ADVIL,MOTRIN) 200 MG tablet Take 400 mg by mouth every 8 (eight) hours as needed for moderate pain.     [provider]  Menthol-Methyl Salicylate (THERA-GESIC PLUS) CREA Apply 1 application topically 3 (three) times daily as needed (pain).    [provider]  methocarbamol (ROBAXIN) 500 MG tablet Take 1 tablet (500 mg total) by mouth every 6 (six) hours as needed for muscle spasms. 03/02/20   Dorothy Spark, PA-C  Multiple Vitamin (MULITIVITAMIN WITH MINERALS) TABS Take 1 tablet by mouth daily.     [provider]  Omega-3 Fatty Acids (FISH OIL) 500 MG CAPS Take 500 mg by mouth every evening.     [provider]  oxyCODONE-acetaminophen (PERCOCET) 5-325 MG tablet Take 1-2 tablets by mouth every 4 (four) hours as needed for severe pain. 03/02/20   Jene Every, MD  saw palmetto 500 MG capsule Take 500 mg by mouth daily.     [provider]  terazosin (HYTRIN) 1 MG capsule Take 1 mg by mouth at bedtime.    [provider]  Turmeric 500 MG CAPS Take 500 mg by mouth daily.     [provider]  venlafaxine XR (EFFEXOR-XR) 150 MG 24 hr capsule Take 300 mg by mouth every evening.    [provider]  zolpidem (AMBIEN) 10 MG tablet Take 5 mg by mouth at bedtime.     [provider]    Family History Family History  Problem Relation Age of Onset   Heart disease Father    Diabetes Father    Cancer Father        prostate   Prostate cancer Other     Social History Social History   Tobacco Use   Smoking status: Former    Current packs/day: 0.00    Types: Cigarettes    Quit date: 09/16/1986    Years since quitting: 37.4   Smokeless tobacco: Never  Vaping Use   Vaping status: Never Used  Substance Use Topics   Alcohol use:  Yes    Comment: occasionally   Drug use: No     Allergies   Patient has no known allergies.   Review of Systems Review of Systems Per HPI  Physical Exam Triage Vital Signs ED Triage Vitals  Encounter Vitals Group     BP 03/01/24 1532 137/78     Systolic BP Percentile --      Diastolic BP Percentile --      Pulse Rate 03/01/24 1532 70     Resp 03/01/24 1532 16  Temp 03/01/24 1532 98 F (36.7 C)     Temp Source 03/01/24 1532 Oral     SpO2 03/01/24 1532 96 %     Weight --      Height --      Head Circumference --      Peak Flow --      Pain Score 03/01/24 1526 10     Pain Loc --      Pain Education --      Exclude from Growth Chart --    No data found.  Updated Vital Signs BP 137/78 (BP Location: Right Arm)   Pulse 70   Temp 98 F (36.7 C) (Oral)   Resp 16   SpO2 96%   Visual Acuity Right Eye Distance:   Left Eye Distance:   Bilateral Distance:    Right Eye Near:   Left Eye Near:    Bilateral Near:     Physical Exam Constitutional:      General: He is not in acute distress.    Appearance: Normal appearance. He is not toxic-appearing or diaphoretic.  HENT:     Head: Normocephalic and atraumatic.     Right Ear: Tympanic membrane and ear canal normal.     Left Ear: Tympanic membrane and ear canal normal.     Nose: Congestion present.     Mouth/Throat:     Mouth: Mucous membranes are moist.     Pharynx: Posterior oropharyngeal erythema present.     Comments: Patient's posterior pharynx is very erythematous. Eyes:     Extraocular Movements: Extraocular movements intact.     Conjunctiva/sclera: Conjunctivae normal.     Pupils: Pupils are equal, round, and reactive to light.  Cardiovascular:     Rate and Rhythm: Normal rate and regular rhythm.     Pulses: Normal pulses.     Heart sounds: Normal heart sounds.  Pulmonary:     Effort: Pulmonary effort is normal. No respiratory distress.     Breath sounds: Normal breath sounds. No wheezing.   Abdominal:     General: Abdomen is flat. Bowel sounds are normal.     Palpations: Abdomen is soft.  Musculoskeletal:        General: Normal range of motion.     Cervical back: Normal range of motion.  Skin:    General: Skin is warm and dry.  Neurological:     General: No focal deficit present.     Mental Status: He is alert and oriented to person, place, and time. Mental status is at baseline.  Psychiatric:        Mood and Affect: Mood normal.        Behavior: Behavior normal.      UC Treatments / Results  Labs (all labs ordered are listed, but only abnormal results are displayed) Labs Reviewed  POCT RAPID STREP A (OFFICE) - Normal  CULTURE, GROUP A STREP [facility])    EKG   Radiology No results found.  Procedures Procedures (including critical care time)  Medications Ordered in UC Medications - No data to display  Initial Impression / Assessment and Plan / UC Course  I have reviewed the triage vital signs and the nursing notes.  Pertinent labs & imaging results that were available during my care of the patient were reviewed by me and considered in my medical decision making (see chart for details).     Rapid strep is negative but I am still highly suspicious of strep throat given appearance of  posterior pharynx on exam.  Patient declined flu testing.  Reports negative COVID test at home.  Given appearance of posterior pharynx on exam, will opt to treat with amoxicillin antibiotic while awaiting throat culture.  Patient was advised of supportive care and symptom management.  Advised strict follow-up precautions.  Patient verbalized understanding and was agreeable with plan. Final Clinical Impressions(s) / UC Diagnoses   Final diagnoses:  Acute pharyngitis, unspecified etiology     Discharge Instructions      I have prescribed you an antibiotic for symptoms.  Throat culture is pending.  Will call if it is abnormal.  Please follow-up if any symptoms persist or  worsen.    ED Prescriptions     Medication Sig Dispense Auth. Provider   amoxicillin (AMOXIL) 500 MG capsule Take 1 capsule (500 mg total) by mouth 2 (two) times daily for 10 days. 20 capsule Gustavus Bryant, [place]      PDMP not reviewed this encounter.   Gustavus Bryant, [place] 03/01/24 [phone]

## 2024-03-04 LAB — CULTURE, GROUP A STREP (THRC)
